# Patient Record
Sex: Male | Born: 1955 | Race: White | Hispanic: No | Marital: Single | Smoking: Current every day smoker
Health system: Southern US, Community
[De-identification: ages and names within clinical notes are randomized; demographics above are authoritative.]

## PROBLEM LIST (undated history)

## (undated) HISTORY — PX: ANKLE FRACTURE SURGERY: SHX122

---

## 1999-04-10 ENCOUNTER — Emergency Department (HOSPITAL_COMMUNITY): Admission: EM | Admit: 1999-04-10 | Discharge: 1999-04-10 | Payer: Self-pay | Admitting: Emergency Medicine

## 2011-10-09 ENCOUNTER — Encounter (HOSPITAL_COMMUNITY): Payer: Self-pay | Admitting: Emergency Medicine

## 2011-10-09 ENCOUNTER — Emergency Department (HOSPITAL_COMMUNITY)
Admission: EM | Admit: 2011-10-09 | Discharge: 2011-10-09 | Disposition: A | Discharge status: Home / Self Care | Payer: Self-pay | Attending: Emergency Medicine | Admitting: Emergency Medicine

## 2011-10-09 ENCOUNTER — Emergency Department (HOSPITAL_COMMUNITY): Payer: Self-pay

## 2011-10-09 DIAGNOSIS — M545 Low back pain, unspecified: Secondary | ICD-10-CM | POA: Insufficient documentation

## 2011-10-09 DIAGNOSIS — IMO0002 Reserved for concepts with insufficient information to code with codable children: Secondary | ICD-10-CM

## 2011-10-09 DIAGNOSIS — S39012A Strain of muscle, fascia and tendon of lower back, initial encounter: Secondary | ICD-10-CM

## 2011-10-09 DIAGNOSIS — F17200 Nicotine dependence, unspecified, uncomplicated: Secondary | ICD-10-CM | POA: Insufficient documentation

## 2011-10-09 MED ORDER — CYCLOBENZAPRINE HCL 10 MG PO TABS: 10.0000 mg | ORAL_TABLET | Freq: Three times a day (TID) | ORAL | Status: AC | PRN

## 2011-10-09 MED ORDER — PREDNISONE 50 MG PO TABS: 50.0000 mg | ORAL_TABLET | Freq: Every day | ORAL | Status: AC

## 2011-10-09 MED ORDER — KETOROLAC TROMETHAMINE 60 MG/2ML IM SOLN
60.0000 mg | Freq: Once | INTRAMUSCULAR | Status: AC
  Administered 2011-10-09: 60 mg via INTRAMUSCULAR
  Filled 2011-10-09: qty 2

## 2011-10-09 MED ORDER — HYDROCODONE-ACETAMINOPHEN 5-325 MG PO TABS: 1.0000 | ORAL_TABLET | Freq: Four times a day (QID) | ORAL | Status: AC | PRN
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
## 2011-10-09 NOTE — ED Notes (Addendum)
Pt reports having back pain that started over a year ago that has progressively gotten worse.  At its worse the pain is 10/10 with it being constant burning pain for about 2-3 hours before any relief.  Pain is 5/10 at the moment.  The pain radiates from his lower back bilaterally to the right side of his abdomen.  The pain in the past has cause him to vomit with his last episode being this past Monday.  Pt has a history of back problems and spasms but none like what he is experiencing today. Pt denies any n/v/d today.  Pt takes acetaminophen and advil at home.  He also wears a back brace but did not bring it today. Nad.

## 2011-10-09 NOTE — ED Provider Notes (Signed)
Medical screening examination/treatment/procedure(s) were performed by non-physician practitioner and as supervising physician I was immediately available for consultation/collaboration.  Flint Melter, MD 10/09/11 1729

## 2011-10-09 NOTE — ED Provider Notes (Signed)
History     CSN: [id number]  Arrival date & time 10/09/11  4098   First MD Initiated Contact with Patient 10/09/11 [phone]      Chief Complaint  Patient presents with  . Back Pain    (Consider location/radiation/quality/duration/timing/severity/associated sxs/prior treatment) HPI Patient presents emergency department with dental lower back pain and been ongoing for a long period of time patient states, that he's had increased pain over the last 2-3 days. The patient denies CP, SOB, weakness, numbness, nausea, vomiting, gait disturbance, dysuria, bowel incontinence, or bladder incontinence. The patient denies taking anything prior to arrival.  History reviewed. No pertinent past medical history.  Past Surgical History  Procedure Date  . Ankle fracture surgery     metal plate with 9 scews    Family History  Problem Relation Age of Onset  . Diabetes Mother   . Diabetes Father   . Cancer Sister     History  Substance Use Topics  . Smoking status: Current Everyday Smoker -- 2.0 packs/day for 20 years    Types: Cigarettes  . Smokeless tobacco: Not on file  . Alcohol Use: No      Review of Systems All other systems negative except as documented in the HPI. All pertinent positives and negatives as reviewed in the HPI.  Allergies  Review of patient's allergies indicates no known allergies.  Home Medications   Current Outpatient Rx  Name Route Sig Dispense Refill  . ADVIL PM PO Oral Take 2 tablets by mouth once. sleep      BP 158/98  Pulse 101  Temp 99.8 F (37.7 C) (Oral)  Resp 18  Ht 5' 11.5" (1.816 m)  Wt 152 lb (68.947 kg)  BMI 20.90 kg/m2  SpO2 100%  Physical Exam  Nursing note and vitals reviewed. Constitutional: He is oriented to person, place, and time. He appears well-developed and well-nourished. No distress.  Cardiovascular: Normal rate, regular rhythm and normal heart sounds.   Pulmonary/Chest: Effort normal and breath sounds normal. He has no  wheezes. He exhibits no tenderness.  Abdominal: Soft. Bowel sounds are normal. He exhibits no distension. There is no tenderness.  Musculoskeletal:       Thoracic back: He exhibits tenderness. He exhibits normal range of motion, no bony tenderness, no deformity and no spasm.       Lumbar back: He exhibits normal range of motion, no tenderness and no bony tenderness.       Back:  Neurological: He is alert and oriented to person, place, and time. He displays normal reflexes. Coordination normal.    ED Course  Procedures (including critical care time)  Labs Reviewed - No data to display Dg Thoracic Spine 2 View  10/09/2011  *RADIOLOGY REPORT*  Clinical Data: Chronic diffuse upper and lower back pain  THORACIC SPINE - 2 VIEW  Comparison: None.  Findings: Normal alignment of the thoracic vertebral bodies.  No loss of to body height or disc height.  No subluxation. Degenerative osteophytosis of the thoracic spine. .  Normal paraspinal lines.  IMPRESSION: No acute findings of the thoracic spine.  Degenerative osteophytosis noted.   Original Report Authenticated By: Genevive Bi, M.D.    Dg Lumbar Spine Complete  10/09/2011  *RADIOLOGY REPORT*  Clinical Data: Low back pain  LUMBAR SPINE - COMPLETE 4+ VIEW  Comparison: None.  Findings: Five lumbar-type vertebral bodies.  No evidence of fracture or dislocation.  Vertebral body heights are maintained.  Mild multilevel degenerative changes.  Visualized bony pelvis  appears intact.  IMPRESSION: No fracture or dislocation is seen.  Mild multilevel degenerative changes.   Original Report Authenticated By: Charline Bills, M.D.      Patient has normal neuro function. The patient is advised the use of ice and heat. This pain is chronic over several years. The patient has normal DTRs. The patient is able to ambulate without issues. Told to return here as needed.  MDM          Carlyle Dolly, PA-C 10/09/11 1341

## 2011-10-09 NOTE — ED Notes (Signed)
Bed:WA05<BR> Expected date:<BR> Expected time:<BR> Means of arrival:<BR> Comments:<BR> closed

## 2018-12-17 DIAGNOSIS — G8929 Other chronic pain: Secondary | ICD-10-CM | POA: Insufficient documentation

## 2018-12-30 DIAGNOSIS — R7303 Prediabetes: Secondary | ICD-10-CM | POA: Insufficient documentation

## 2018-12-30 DIAGNOSIS — E782 Mixed hyperlipidemia: Secondary | ICD-10-CM | POA: Diagnosis present

## 2018-12-30 DIAGNOSIS — I10 Essential (primary) hypertension: Secondary | ICD-10-CM | POA: Diagnosis present

## 2020-04-09 ENCOUNTER — Other Ambulatory Visit: Payer: Self-pay

## 2020-04-09 ENCOUNTER — Observation Stay (HOSPITAL_COMMUNITY)
Admission: EM | Admit: 2020-04-09 | Discharge: 2020-04-10 | Disposition: A | Discharge status: Home / Self Care | Payer: BLUE CROSS/BLUE SHIELD | Attending: Surgery | Admitting: Surgery

## 2020-04-09 ENCOUNTER — Emergency Department (HOSPITAL_COMMUNITY): Payer: BLUE CROSS/BLUE SHIELD

## 2020-04-09 ENCOUNTER — Encounter (HOSPITAL_COMMUNITY): Payer: Self-pay | Admitting: Pharmacy Technician

## 2020-04-09 DIAGNOSIS — S0990XA Unspecified injury of head, initial encounter: Secondary | ICD-10-CM

## 2020-04-09 DIAGNOSIS — Z20822 Contact with and (suspected) exposure to covid-19: Secondary | ICD-10-CM | POA: Insufficient documentation

## 2020-04-09 DIAGNOSIS — J939 Pneumothorax, unspecified: Secondary | ICD-10-CM | POA: Diagnosis not present

## 2020-04-09 DIAGNOSIS — I700 Atherosclerosis of aorta: Secondary | ICD-10-CM | POA: Insufficient documentation

## 2020-04-09 DIAGNOSIS — K8020 Calculus of gallbladder without cholecystitis without obstruction: Secondary | ICD-10-CM | POA: Insufficient documentation

## 2020-04-09 DIAGNOSIS — S2242XA Multiple fractures of ribs, left side, initial encounter for closed fracture: Secondary | ICD-10-CM | POA: Diagnosis not present

## 2020-04-09 DIAGNOSIS — S0001XA Abrasion of scalp, initial encounter: Secondary | ICD-10-CM | POA: Diagnosis not present

## 2020-04-09 DIAGNOSIS — F10920 Alcohol use, unspecified with intoxication, uncomplicated: Secondary | ICD-10-CM

## 2020-04-09 DIAGNOSIS — F10220 Alcohol dependence with intoxication, uncomplicated: Secondary | ICD-10-CM | POA: Diagnosis not present

## 2020-04-09 DIAGNOSIS — K4090 Unilateral inguinal hernia, without obstruction or gangrene, not specified as recurrent: Secondary | ICD-10-CM | POA: Insufficient documentation

## 2020-04-09 DIAGNOSIS — S270XXA Traumatic pneumothorax, initial encounter: Secondary | ICD-10-CM

## 2020-04-09 LAB — RESP PANEL BY RT-PCR (FLU A&B, COVID) ARPGX2
Influenza A by PCR: NEGATIVE
Influenza B by PCR: NEGATIVE
SARS Coronavirus 2 by RT PCR: NEGATIVE

## 2020-04-09 LAB — CBC
HCT: 47.5 % (ref 39.0–52.0)
HCT: 47.9 % (ref 39.0–52.0)
Hemoglobin: 16 g/dL (ref 13.0–17.0)
Hemoglobin: 16 g/dL (ref 13.0–17.0)
MCH: 33.8 pg (ref 26.0–34.0)
MCH: 32.6 pg (ref 26.0–34.0)
MCHC: 33.7 g/dL (ref 30.0–36.0)
MCHC: 33.4 g/dL (ref 30.0–36.0)
MCV: 100.2 fL — ABNORMAL HIGH (ref 80.0–100.0)
MCV: 97.6 fL (ref 80.0–100.0)
Platelets: 215 10*3/uL (ref 150–400)
Platelets: 243 10*3/uL (ref 150–400)
RBC: 4.74 MIL/uL (ref 4.22–5.81)
RBC: 4.91 MIL/uL (ref 4.22–5.81)
RDW: 12.6 % (ref 11.5–15.5)
RDW: 12.7 % (ref 11.5–15.5)
WBC: 9 10*3/uL (ref 4.0–10.5)
WBC: 14.7 10*3/uL — ABNORMAL HIGH (ref 4.0–10.5)
nRBC: 0 % (ref 0.0–0.2)
nRBC: 0 % (ref 0.0–0.2)

## 2020-04-09 LAB — COMPREHENSIVE METABOLIC PANEL
ALT: 23 U/L (ref 0–44)
ALT: 26 U/L (ref 0–44)
AST: 26 U/L (ref 15–41)
AST: 51 U/L — ABNORMAL HIGH (ref 15–41)
Albumin: 4.1 g/dL (ref 3.5–5.0)
Albumin: 4.4 g/dL (ref 3.5–5.0)
Alkaline Phosphatase: 55 U/L (ref 38–126)
Alkaline Phosphatase: 54 U/L (ref 38–126)
Anion gap: 12 (ref 5–15)
Anion gap: 13 (ref 5–15)
BUN: 15 mg/dL (ref 8–23)
BUN: 12 mg/dL (ref 8–23)
CO2: 21 mmol/L — ABNORMAL LOW (ref 22–32)
CO2: 24 mmol/L (ref 22–32)
Calcium: 9 mg/dL (ref 8.9–10.3)
Calcium: 9 mg/dL (ref 8.9–10.3)
Chloride: 110 mmol/L (ref 98–111)
Chloride: 104 mmol/L (ref 98–111)
Creatinine, Ser: 0.96 mg/dL (ref 0.61–1.24)
Creatinine, Ser: 0.83 mg/dL (ref 0.61–1.24)
GFR, Estimated: >60 mL/min (ref >60)
GFR, Estimated: >60 mL/min (ref >60)
Glucose, Bld: 100 mg/dL — ABNORMAL HIGH (ref 70–99)
Glucose, Bld: 113 mg/dL — ABNORMAL HIGH (ref 70–99)
Potassium: 3.9 mmol/L (ref 3.5–5.1)
Potassium: 3.9 mmol/L (ref 3.5–5.1)
Sodium: 143 mmol/L (ref 135–145)
Sodium: 141 mmol/L (ref 135–145)
Total Bilirubin: 0.7 mg/dL (ref 0.3–1.2)
Total Bilirubin: 0.6 mg/dL (ref 0.3–1.2)
Total Protein: 6.5 g/dL (ref 6.5–8.1)
Total Protein: 7 g/dL (ref 6.5–8.1)

## 2020-04-09 LAB — I-STAT CHEM 8, ED
BUN: 20 mg/dL (ref 8–23)
Calcium, Ion: 0.91 mmol/L — ABNORMAL LOW (ref 1.15–1.40)
Chloride: 110 mmol/L (ref 98–111)
Creatinine, Ser: 1.1 mg/dL (ref 0.61–1.24)
Glucose, Bld: 96 mg/dL (ref 70–99)
HCT: 47 % (ref 39.0–52.0)
Hemoglobin: 16 g/dL (ref 13.0–17.0)
Potassium: 4.6 mmol/L (ref 3.5–5.1)
Sodium: 142 mmol/L (ref 135–145)
TCO2: 25 mmol/L (ref 22–32)

## 2020-04-09 LAB — PROTIME-INR
INR: 1.1 (ref 0.8–1.2)
Prothrombin Time: 13.4 seconds (ref 11.4–15.2)

## 2020-04-09 LAB — MAGNESIUM: Magnesium: 1.9 mg/dL (ref 1.7–2.4)

## 2020-04-09 LAB — URINALYSIS, ROUTINE W REFLEX MICROSCOPIC
Bacteria, UA: NONE SEEN
Bilirubin Urine: NEGATIVE
Glucose, UA: NEGATIVE mg/dL
Ketones, ur: NEGATIVE mg/dL
Leukocytes,Ua: NEGATIVE
Nitrite: NEGATIVE
Protein, ur: NEGATIVE mg/dL
Specific Gravity, Urine: 1.005 (ref 1.005–1.030)
pH: 5 (ref 5.0–8.0)

## 2020-04-09 LAB — SAMPLE TO BLOOD BANK

## 2020-04-09 LAB — HIV ANTIBODY (ROUTINE TESTING W REFLEX): HIV Screen 4th Generation wRfx: NONREACTIVE

## 2020-04-09 LAB — LACTIC ACID, PLASMA: Lactic Acid, Venous: 2.3 mmol/L (ref 0.5–1.9)

## 2020-04-09 LAB — ETHANOL: Alcohol, Ethyl (B): 207 mg/dL — ABNORMAL HIGH (ref <10)

## 2020-04-09 LAB — PHOSPHORUS: Phosphorus: 2.9 mg/dL (ref 2.5–4.6)

## 2020-04-09 MED ORDER — LORAZEPAM 1 MG PO TABS: 1.0000 mg | ORAL_TABLET | ORAL | Status: DC | PRN

## 2020-04-09 MED ORDER — FOLIC ACID 1 MG PO TABS
1.0000 mg | ORAL_TABLET | Freq: Every day | ORAL | Status: DC
  Administered 2020-04-09: 1 mg via ORAL
  Filled 2020-04-09: qty 1

## 2020-04-09 MED ORDER — ADULT MULTIVITAMIN W/MINERALS CH
1.0000 | ORAL_TABLET | Freq: Every day | ORAL | Status: DC
  Administered 2020-04-09: 1 via ORAL
  Filled 2020-04-09: qty 1

## 2020-04-09 MED ORDER — SPIRITUS FRUMENTI: 1.0000 | Freq: Three times a day (TID) | ORAL | Status: DC | PRN

## 2020-04-09 MED ORDER — SODIUM CHLORIDE 0.9 % IV BOLUS: 125.0000 mL | Freq: Once | INTRAVENOUS | Status: DC

## 2020-04-09 MED ORDER — IOHEXOL 300 MG/ML  SOLN
100.0000 mL | Freq: Once | INTRAMUSCULAR | Status: AC | PRN
  Administered 2020-04-09: 100 mL via INTRAVENOUS

## 2020-04-09 MED ORDER — SODIUM CHLORIDE 0.9 % IV SOLN: INTRAVENOUS | Status: DC

## 2020-04-09 MED ORDER — BISACODYL 10 MG RE SUPP: 10.0000 mg | Freq: Every day | RECTAL | Status: DC | PRN

## 2020-04-09 MED ORDER — ACETAMINOPHEN 325 MG PO TABS: 650.0000 mg | ORAL_TABLET | ORAL | Status: DC | PRN

## 2020-04-09 MED ORDER — AMLODIPINE BESYLATE 10 MG PO TABS
10.0000 mg | ORAL_TABLET | Freq: Every day | ORAL | Status: DC
  Administered 2020-04-09: 10 mg via ORAL
  Filled 2020-04-09: qty 1

## 2020-04-09 MED ORDER — LORAZEPAM 2 MG/ML IJ SOLN: 1.0000 mg | INTRAMUSCULAR | Status: DC | PRN

## 2020-04-09 MED ORDER — ONDANSETRON 4 MG PO TBDP: 4.0000 mg | ORAL_TABLET | Freq: Four times a day (QID) | ORAL | Status: DC | PRN

## 2020-04-09 MED ORDER — ENOXAPARIN SODIUM 30 MG/0.3ML ~~LOC~~ SOLN: 30.0000 mg | Freq: Two times a day (BID) | SUBCUTANEOUS | Status: DC

## 2020-04-09 MED ORDER — OXYCODONE HCL 5 MG PO TABS
5.0000 mg | ORAL_TABLET | ORAL | Status: DC | PRN
  Administered 2020-04-09 – 2020-04-10 (×2): 5 mg via ORAL
  Filled 2020-04-09 (×2): qty 1

## 2020-04-09 MED ORDER — POLYETHYLENE GLYCOL 3350 17 G PO PACK
17.0000 g | PACK | Freq: Every day | ORAL | Status: DC
  Administered 2020-04-09: 17 g via ORAL
  Filled 2020-04-09: qty 1

## 2020-04-09 MED ORDER — METHOCARBAMOL 500 MG PO TABS
500.0000 mg | ORAL_TABLET | Freq: Three times a day (TID) | ORAL | Status: DC | PRN
  Administered 2020-04-09 – 2020-04-10 (×2): 500 mg via ORAL
  Filled 2020-04-09 (×2): qty 1

## 2020-04-09 MED ORDER — THIAMINE HCL 100 MG PO TABS
100.0000 mg | ORAL_TABLET | Freq: Every day | ORAL | Status: DC
  Administered 2020-04-09: 100 mg via ORAL
  Filled 2020-04-09: qty 1

## 2020-04-09 MED ORDER — MORPHINE SULFATE (PF) 2 MG/ML IV SOLN: 2.0000 mg | INTRAVENOUS | Status: DC | PRN

## 2020-04-09 MED ORDER — DOCUSATE SODIUM 100 MG PO CAPS
100.0000 mg | ORAL_CAPSULE | Freq: Two times a day (BID) | ORAL | Status: DC
  Administered 2020-04-09: 100 mg via ORAL
  Filled 2020-04-09: qty 1

## 2020-04-09 MED ORDER — ONDANSETRON HCL 4 MG/2ML IJ SOLN: 4.0000 mg | Freq: Four times a day (QID) | INTRAMUSCULAR | Status: DC | PRN

## 2020-04-09 MED ORDER — METOPROLOL TARTRATE 5 MG/5ML IV SOLN: 5.0000 mg | Freq: Four times a day (QID) | INTRAVENOUS | Status: DC | PRN

## 2020-04-09 MED ORDER — THIAMINE HCL 100 MG/ML IJ SOLN: 100.0000 mg | Freq: Every day | INTRAMUSCULAR | Status: DC

## 2020-04-09 NOTE — Plan of Care (Signed)
  Problem: Activity: Goal: Risk for activity intolerance will decrease Outcome: Progressing   Problem: Nutrition: Goal: Adequate nutrition will be maintained Outcome: Progressing   Problem: Coping: Goal: Level of anxiety will decrease Outcome: Progressing   Problem: Safety: Goal: Ability to remain free from injury will improve Outcome: Progressing   Problem: Pain Managment: Goal: General experience of comfort will improve Outcome: Progressing   Problem: Skin Integrity: Goal: Risk for impaired skin integrity will decrease Outcome: Progressing

## 2020-04-09 NOTE — ED Notes (Signed)
Dinner Tray Ordered @ 1648. 
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
## 2020-04-09 NOTE — ED Notes (Signed)
Pt returned from ct
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
## 2020-04-09 NOTE — Progress Notes (Signed)
Patient admitted to room, alert and oriented x3. Skin dry and warm to touch, abrasions to rt forehead, lt shoulder, lt hand and both knees. Patient c/o sore all over. No SOB voiced.

## 2020-04-10 LAB — CBC
HCT: 45.2 % (ref 39.0–52.0)
Hemoglobin: 15.1 g/dL (ref 13.0–17.0)
MCH: 32.8 pg (ref 26.0–34.0)
MCHC: 33.4 g/dL (ref 30.0–36.0)
MCV: 98.3 fL (ref 80.0–100.0)
Platelets: 211 10*3/uL (ref 150–400)
RBC: 4.6 MIL/uL (ref 4.22–5.81)
RDW: 12.7 % (ref 11.5–15.5)
WBC: 11.7 10*3/uL — ABNORMAL HIGH (ref 4.0–10.5)
nRBC: 0 % (ref 0.0–0.2)

## 2020-04-10 LAB — BASIC METABOLIC PANEL
Anion gap: 9 (ref 5–15)
BUN: 12 mg/dL (ref 8–23)
CO2: 27 mmol/L (ref 22–32)
Calcium: 9 mg/dL (ref 8.9–10.3)
Chloride: 104 mmol/L (ref 98–111)
Creatinine, Ser: 0.9 mg/dL (ref 0.61–1.24)
GFR, Estimated: >60 mL/min (ref >60)
Glucose, Bld: 102 mg/dL — ABNORMAL HIGH (ref 70–99)
Potassium: 3.7 mmol/L (ref 3.5–5.1)
Sodium: 140 mmol/L (ref 135–145)

## 2020-04-10 MED ORDER — OXYCODONE HCL 5 MG PO TABS: 5.0000 mg | ORAL_TABLET | ORAL | 0 refills | Status: DC | PRN

## 2020-04-10 MED ORDER — ACETAMINOPHEN 325 MG PO TABS: 650.0000 mg | ORAL_TABLET | ORAL | 1 refills | Status: DC | PRN

## 2020-04-10 MED ORDER — METHOCARBAMOL 500 MG PO TABS: 500.0000 mg | ORAL_TABLET | Freq: Three times a day (TID) | ORAL | 1 refills | Status: DC | PRN

## 2020-04-10 MED ORDER — DOCUSATE SODIUM 100 MG PO CAPS: 100.0000 mg | ORAL_CAPSULE | Freq: Two times a day (BID) | ORAL | 0 refills | Status: DC

## 2020-04-10 NOTE — Discharge Instructions (Signed)
Rib Fracture  A rib fracture is a break or crack in one of the bones of the ribs. The ribs are like a cage that goes around your upper chest. A broken or cracked rib is often painful, but most do not cause other problems. Most rib fractures usually heal on their own in 1-3 months. What are the causes?  Doing movements over and over again with a lot of force, such as pitching a baseball or having a very bad cough.  A direct hit to the chest.  Cancer that has spread to the bones. What are the signs or symptoms?  Pain when you breathe in or cough.  Pain when someone presses on the injured area.  Feeling short of breath. How is this treated? Treatment depends on how bad the fracture is. In general:  Most rib fractures usually heal on their own in 1-3 months.  Healing may take longer if you have a cough or are doing activities that make the injury worse.  While you heal, you may be given medicines to control pain.  You will also be taught deep breathing exercises.  Very bad injuries may require a stay at the hospital or surgery. Follow these instructions at home: Managing pain, stiffness, and swelling  If told, put ice on the injured area. To do this: ? Put ice in a plastic bag. ? Place a towel between your skin and the bag. ? Leave the ice on for 20 minutes, 2-3 times a day. ? Take off the ice if your skin turns bright red. This is very important. If you cannot feel pain, heat, or cold, you have a greater risk of damage to the area.  Take over-the-counter and prescription medicines only as told by your doctor. Activity  Avoid activities that cause pain to the injured area. Protect your injured area.  Slowly increase activity as told by your doctor. General instructions  Do deep breathing exercises as told by your doctor. You may be told to: ? Take deep breaths many times a day. ? Cough several times a day while hugging a pillow. ? Use a device (incentive spirometer) to do  deep breathing many times a day.  Drink enough fluid to keep your pee (urine) clear or pale yellow.  Do not wear a rib belt or binder.  Keep all follow-up visits. Contact a doctor if:  You have a fever. Get help right away if:  You have trouble breathing.  You are short of breath.  You cannot stop coughing.  You cough up thick or bloody spit.  You feel like you may vomit (nauseous), vomit, or have belly (abdominal) pain.  Your pain gets worse and medicine does not help. These symptoms may be an emergency. Get help right away. Call your local emergency services (911 in the [place]  Do not wait to see if the symptoms will go away.  Do not drive yourself to the hospital. Summary  A rib fracture is a break or crack in one of the bones of the ribs.  Apply ice to the injured area and take medicines for pain as told by your doctor.  Take deep breaths and cough several times a day. Hug a pillow every time you cough. This information is not intended to replace advice given to you by your health care provider. Make sure you discuss any questions you have with your health care provider. Document Revised: 05/20/2019 Document Reviewed: 05/20/2019 Elsevier Patient Education  2021 [employer].  Transverse Process Fracture  Bones of the spine (vertebrae) have portions that extend off to either side of the spine. These portions of bone are called transverse processes. A transverse process fracture, which is also called a rotation spine fracture, is a break in a transverse process. What are the causes? This condition may be caused by:  A fall from a great height.  A car accident.  A sports injury.  A gunshot wound.  A hard, direct hit to the back. This kind of fracture often results from a sudden and severe bending of the spine to one side. Depending on the cause of the fracture, one or more bones may be affected. What increases the risk? You are more likely to develop this  condition if:  You have thinning and loss of density in the bones (osteoporosis).  You play a contact sport. What are the signs or symptoms? The main symptom of this condition is back pain. The pain may:  Be felt on the side of the spine (flank) where the fracture is.  Get worse when you move or take a deep breath. How is this diagnosed? This condition may be diagnosed based on:  Your symptoms.  Your medical history.  A physical exam. You may also have other tests, including:  X-rays.  A CT scan.  MRI. How is this treated? Most transverse process fractures heal on their own with time and rest. Treatment may involve supportive care, such as:  Limiting activity.  Medicines, such as: ? Pain medicine. ? Muscle-relaxing medicine.  Physical therapy.  A neck or back brace. Follow these instructions at home: If you have a brace:  Wear the neck or back brace as told by your health care provider. Remove it only as told by your health care provider.  Keep the brace clean.  If the brace is not waterproof: ? Do not let it get wet. ? Cover it with a watertight covering when you take a bath or a shower. Managing pain, stiffness, and swelling  If directed, put ice on the injured area: ? If you have a removable brace, remove it as told by your health care provider. ? Put ice in a plastic bag. ? Place a towel between your skin and the bag. ? Leave the ice on for 20 minutes, 2-3 times a day.   Medicines  Take over-the-counter and prescription medicines only as told by your health care provider.  Do not drive or use heavy machinery while taking prescription pain medicine.  If you are taking prescription pain medicine, take actions to prevent or treat constipation. Your health care provider may recommend that you: ? Drink enough fluid to keep your urine pale yellow. ? Eat foods that are high in fiber, such as fresh fruits and vegetables, whole grains, and beans. ? Limit  foods that are high in fat and processed sugars, such as fried or sweet foods. ? Take an over-the-counter or prescription medicine for constipation. Activity  Stay in bed (on bed rest) only as directed by your health care provider. ? Avoid being in bed for a long time without moving. Get up to take short walks every 1-2 hours. This is important to improve blood flow and breathing. Ask for help if you feel weak or unsteady.  Return to your normal activities when your health care provider says it is okay. Ask if there are any activities that you should not do.  Do physical therapy exercises as recommended by your health care provider.  General instructions  Do not use any products that contain nicotine or tobacco, such as cigarettes and e-cigarettes. These can delay bone healing. If you need help quitting, ask your health care provider.  Keep all follow-up visits as told by your health care provider. This is important. Visits can help to prevent permanent injury, disability, and long-lasting (chronic) pain. Contact a health care provider if:  You have a fever.  You develop a cough that makes your pain worse.  Your pain medicine is not helping.  Your pain does not get better over time.  You cannot return to your normal activities as planned or expected. Get help right away if:  Your pain is very bad and it suddenly gets worse.  You are unable to move any body part (paralysis) that is below the level of your injury.  You have numbness, tingling, or weakness in any body part that is below the level of your injury.  You cannot control your bladder or bowels. Summary  A transverse process fracture is a break in the portion of the bone that extends to the side of the spine.  Most transverse process fractures heal on their own with time and rest.  You may also have supportive treatments such as a back brace, pain medicines, and physical therapy.  Keep all follow-up visits. This is  important and will help to prevent permanent injury, disability, and long-lasting (chronic) pain. This information is not intended to replace advice given to you by your health care provider. Make sure you discuss any questions you have with your health care provider. Document Revised: 03/11/2017 Document Reviewed: 03/11/2017 Elsevier Patient Education  2021 [employer].

## 2020-04-10 NOTE — Plan of Care (Signed)
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
## 2022-04-23 DIAGNOSIS — F1021 Alcohol dependence, in remission: Secondary | ICD-10-CM | POA: Insufficient documentation

## 2022-08-16 ENCOUNTER — Observation Stay (HOSPITAL_COMMUNITY): Payer: Medicare HMO

## 2022-08-16 ENCOUNTER — Inpatient Hospital Stay (HOSPITAL_COMMUNITY)
Admission: EM | Admit: 2022-08-16 | Discharge: 2022-08-22 | DRG: 896 | Disposition: A | Discharge status: Hospice | Payer: Medicare HMO | Attending: Internal Medicine | Admitting: Internal Medicine

## 2022-08-16 ENCOUNTER — Emergency Department (HOSPITAL_COMMUNITY): Payer: Medicare HMO

## 2022-08-16 ENCOUNTER — Other Ambulatory Visit: Payer: Self-pay

## 2022-08-16 DIAGNOSIS — F10239 Alcohol dependence with withdrawal, unspecified: Secondary | ICD-10-CM | POA: Diagnosis not present

## 2022-08-16 DIAGNOSIS — E782 Mixed hyperlipidemia: Secondary | ICD-10-CM | POA: Diagnosis present

## 2022-08-16 DIAGNOSIS — R410 Disorientation, unspecified: Secondary | ICD-10-CM

## 2022-08-16 DIAGNOSIS — I10 Essential (primary) hypertension: Secondary | ICD-10-CM | POA: Diagnosis present

## 2022-08-16 DIAGNOSIS — F0392 Unspecified dementia, unspecified severity, with psychotic disturbance: Secondary | ICD-10-CM | POA: Diagnosis present

## 2022-08-16 DIAGNOSIS — W19XXXA Unspecified fall, initial encounter: Secondary | ICD-10-CM | POA: Diagnosis present

## 2022-08-16 DIAGNOSIS — F17210 Nicotine dependence, cigarettes, uncomplicated: Secondary | ICD-10-CM | POA: Diagnosis present

## 2022-08-16 DIAGNOSIS — G9340 Encephalopathy, unspecified: Secondary | ICD-10-CM | POA: Diagnosis present

## 2022-08-16 DIAGNOSIS — D72829 Elevated white blood cell count, unspecified: Secondary | ICD-10-CM | POA: Insufficient documentation

## 2022-08-16 DIAGNOSIS — Y900 Blood alcohol level of less than 20 mg/100 ml: Secondary | ICD-10-CM | POA: Diagnosis present

## 2022-08-16 DIAGNOSIS — F1010 Alcohol abuse, uncomplicated: Secondary | ICD-10-CM

## 2022-08-16 DIAGNOSIS — Z66 Do not resuscitate: Secondary | ICD-10-CM | POA: Diagnosis present

## 2022-08-16 DIAGNOSIS — F0282 Dementia in other diseases classified elsewhere, unspecified severity, with psychotic disturbance: Secondary | ICD-10-CM | POA: Diagnosis present

## 2022-08-16 DIAGNOSIS — E43 Unspecified severe protein-calorie malnutrition: Secondary | ICD-10-CM | POA: Insufficient documentation

## 2022-08-16 DIAGNOSIS — F05 Delirium due to known physiological condition: Secondary | ICD-10-CM | POA: Diagnosis present

## 2022-08-16 DIAGNOSIS — E876 Hypokalemia: Secondary | ICD-10-CM | POA: Diagnosis not present

## 2022-08-16 DIAGNOSIS — Y92009 Unspecified place in unspecified non-institutional (private) residence as the place of occurrence of the external cause: Secondary | ICD-10-CM

## 2022-08-16 DIAGNOSIS — R441 Visual hallucinations: Secondary | ICD-10-CM

## 2022-08-16 DIAGNOSIS — R531 Weakness: Principal | ICD-10-CM

## 2022-08-16 DIAGNOSIS — Z681 Body mass index (BMI) 19 or less, adult: Secondary | ICD-10-CM

## 2022-08-16 DIAGNOSIS — E512 Wernicke's encephalopathy: Secondary | ICD-10-CM | POA: Diagnosis present

## 2022-08-16 DIAGNOSIS — Z5941 Food insecurity: Secondary | ICD-10-CM

## 2022-08-16 DIAGNOSIS — E8809 Other disorders of plasma-protein metabolism, not elsewhere classified: Secondary | ICD-10-CM | POA: Diagnosis present

## 2022-08-16 DIAGNOSIS — Z79899 Other long term (current) drug therapy: Secondary | ICD-10-CM

## 2022-08-16 DIAGNOSIS — M6282 Rhabdomyolysis: Secondary | ICD-10-CM | POA: Diagnosis not present

## 2022-08-16 DIAGNOSIS — Z833 Family history of diabetes mellitus: Secondary | ICD-10-CM

## 2022-08-16 DIAGNOSIS — R296 Repeated falls: Secondary | ICD-10-CM | POA: Diagnosis present

## 2022-08-16 DIAGNOSIS — Z515 Encounter for palliative care: Secondary | ICD-10-CM

## 2022-08-16 LAB — URINALYSIS, ROUTINE W REFLEX MICROSCOPIC
Bilirubin Urine: NEGATIVE
Glucose, UA: NEGATIVE mg/dL
Ketones, ur: NEGATIVE mg/dL
Leukocytes,Ua: NEGATIVE
Nitrite: NEGATIVE
Protein, ur: NEGATIVE mg/dL
Specific Gravity, Urine: 1.013 (ref 1.005–1.030)
pH: 5 (ref 5.0–8.0)

## 2022-08-16 LAB — BASIC METABOLIC PANEL WITH GFR
Anion gap: 14 (ref 5–15)
BUN: 24 mg/dL — ABNORMAL HIGH (ref 8–23)
CO2: 20 mmol/L — ABNORMAL LOW (ref 22–32)
Calcium: 9.4 mg/dL (ref 8.9–10.3)
Chloride: 97 mmol/L — ABNORMAL LOW (ref 98–111)
Creatinine, Ser: 1.12 mg/dL (ref 0.61–1.24)
GFR, Estimated: >60 mL/min
Glucose, Bld: 100 mg/dL — ABNORMAL HIGH (ref 70–99)
Potassium: 4.4 mmol/L (ref 3.5–5.1)
Sodium: 131 mmol/L — ABNORMAL LOW (ref 135–145)

## 2022-08-16 LAB — CK: Total CK: 1434 U/L — ABNORMAL HIGH (ref 49–397)

## 2022-08-16 LAB — RAPID URINE DRUG SCREEN, HOSP PERFORMED
Amphetamines: NOT DETECTED
Barbiturates: NOT DETECTED
Benzodiazepines: NOT DETECTED
Cocaine: NOT DETECTED
Opiates: NOT DETECTED
Tetrahydrocannabinol: NOT DETECTED

## 2022-08-16 LAB — PHOSPHORUS: Phosphorus: 3.6 mg/dL (ref 2.5–4.6)

## 2022-08-16 LAB — CBC
HCT: 42 % (ref 39.0–52.0)
Hemoglobin: 14.4 g/dL (ref 13.0–17.0)
MCH: 31.9 pg (ref 26.0–34.0)
MCHC: 34.3 g/dL (ref 30.0–36.0)
MCV: 93.1 fL (ref 80.0–100.0)
Platelets: 312 K/uL (ref 150–400)
RBC: 4.51 MIL/uL (ref 4.22–5.81)
RDW: 13.2 % (ref 11.5–15.5)
WBC: 15.9 K/uL — ABNORMAL HIGH (ref 4.0–10.5)
nRBC: 0 % (ref 0.0–0.2)

## 2022-08-16 LAB — HEPATIC FUNCTION PANEL
ALT: 35 U/L (ref 0–44)
AST: 47 U/L — ABNORMAL HIGH (ref 15–41)
Albumin: 4.3 g/dL (ref 3.5–5.0)
Alkaline Phosphatase: 63 U/L (ref 38–126)
Bilirubin, Direct: 0.2 mg/dL (ref 0.0–0.2)
Indirect Bilirubin: 0.7 mg/dL (ref 0.3–0.9)
Total Bilirubin: 0.9 mg/dL (ref 0.3–1.2)
Total Protein: 7.1 g/dL (ref 6.5–8.1)

## 2022-08-16 LAB — TSH: TSH: 2.196 u[IU]/mL (ref 0.350–4.500)

## 2022-08-16 LAB — MAGNESIUM: Magnesium: 2.2 mg/dL (ref 1.7–2.4)

## 2022-08-16 LAB — VITAMIN B12: Vitamin B-12: 475 pg/mL (ref 180–914)

## 2022-08-16 LAB — ETHANOL: Alcohol, Ethyl (B): <10 mg/dL (ref <10)

## 2022-08-16 LAB — HIV ANTIBODY (ROUTINE TESTING W REFLEX): HIV Screen 4th Generation wRfx: NONREACTIVE

## 2022-08-16 LAB — AMMONIA: Ammonia: 35 umol/L (ref 9–35)

## 2022-08-16 LAB — CBG MONITORING, ED: Glucose-Capillary: 99 mg/dL (ref 70–99)

## 2022-08-16 MED ORDER — SODIUM CHLORIDE 0.9% FLUSH
3.0000 mL | Freq: Two times a day (BID) | INTRAVENOUS | Status: DC
  Administered 2022-08-17 – 2022-08-20 (×6): 3 mL via INTRAVENOUS

## 2022-08-16 MED ORDER — LORAZEPAM 2 MG/ML IJ SOLN
1.0000 mg | INTRAMUSCULAR | Status: AC | PRN
  Administered 2022-08-16: 2 mg via INTRAVENOUS
  Administered 2022-08-17: 1 mg via INTRAVENOUS
  Administered 2022-08-18 – 2022-08-19 (×4): 2 mg via INTRAVENOUS
  Filled 2022-08-16 (×7): qty 1

## 2022-08-16 MED ORDER — NICOTINE POLACRILEX 2 MG MT GUM: 2.0000 mg | CHEWING_GUM | OROMUCOSAL | Status: DC | PRN

## 2022-08-16 MED ORDER — THIAMINE HCL 100 MG/ML IJ SOLN
500.0000 mg | Freq: Three times a day (TID) | INTRAVENOUS | Status: DC
  Administered 2022-08-16 (×2): 500 mg via INTRAVENOUS
  Filled 2022-08-16 (×7): qty 5

## 2022-08-16 MED ORDER — ROSUVASTATIN CALCIUM 5 MG PO TABS
10.0000 mg | ORAL_TABLET | Freq: Every day | ORAL | Status: DC
  Administered 2022-08-17: 10 mg via ORAL
  Filled 2022-08-16: qty 2

## 2022-08-16 MED ORDER — SODIUM CHLORIDE 0.9 % IV SOLN: INTRAVENOUS | Status: DC | PRN

## 2022-08-16 MED ORDER — ENOXAPARIN SODIUM 40 MG/0.4ML IJ SOSY
40.0000 mg | PREFILLED_SYRINGE | INTRAMUSCULAR | Status: DC
  Administered 2022-08-16 – 2022-08-21 (×6): 40 mg via SUBCUTANEOUS
  Filled 2022-08-16 (×6): qty 0.4

## 2022-08-16 MED ORDER — NICOTINE 14 MG/24HR TD PT24
14.0000 mg | MEDICATED_PATCH | Freq: Every day | TRANSDERMAL | Status: DC
  Administered 2022-08-16 – 2022-08-22 (×7): 14 mg via TRANSDERMAL
  Filled 2022-08-16 (×7): qty 1

## 2022-08-16 MED ORDER — ACETAMINOPHEN 650 MG RE SUPP: 650.0000 mg | Freq: Four times a day (QID) | RECTAL | Status: DC | PRN

## 2022-08-16 MED ORDER — LORAZEPAM 1 MG PO TABS
1.0000 mg | ORAL_TABLET | ORAL | Status: AC | PRN
  Administered 2022-08-18: 2 mg via ORAL
  Administered 2022-08-19: 1 mg via ORAL
  Filled 2022-08-16: qty 1
  Filled 2022-08-16: qty 2

## 2022-08-16 MED ORDER — FOLIC ACID 1 MG PO TABS
1.0000 mg | ORAL_TABLET | Freq: Every day | ORAL | Status: DC
  Administered 2022-08-16 – 2022-08-20 (×5): 1 mg via ORAL
  Filled 2022-08-16 (×5): qty 1

## 2022-08-16 MED ORDER — AMLODIPINE BESYLATE 10 MG PO TABS
10.0000 mg | ORAL_TABLET | Freq: Every day | ORAL | Status: DC
  Administered 2022-08-17 – 2022-08-21 (×5): 10 mg via ORAL
  Filled 2022-08-16 (×5): qty 1

## 2022-08-16 MED ORDER — ADULT MULTIVITAMIN W/MINERALS CH
1.0000 | ORAL_TABLET | Freq: Every day | ORAL | Status: DC
  Administered 2022-08-16 – 2022-08-20 (×5): 1 via ORAL
  Filled 2022-08-16 (×5): qty 1

## 2022-08-16 MED ORDER — ACETAMINOPHEN 325 MG PO TABS: 650.0000 mg | ORAL_TABLET | Freq: Four times a day (QID) | ORAL | Status: DC | PRN

## 2022-08-16 MED ORDER — LACTATED RINGERS IV BOLUS
1000.0000 mL | Freq: Once | INTRAVENOUS | Status: AC
  Administered 2022-08-16: 1000 mL via INTRAVENOUS

## 2022-08-16 MED ORDER — POLYETHYLENE GLYCOL 3350 17 G PO PACK: 17.0000 g | PACK | Freq: Every day | ORAL | Status: DC | PRN

## 2022-08-16 MED ORDER — LORAZEPAM 1 MG PO TABS
1.0000 mg | ORAL_TABLET | ORAL | Status: DC | PRN
  Administered 2022-08-16: 2 mg via ORAL
  Filled 2022-08-16: qty 2

## 2022-08-16 NOTE — H&P (Signed)
History and Physical   Gary Mathis ZOX:[id number] DOB: Apr 12, 1955 DOA: 08/16/2022  PCP: Patient, No Pcp Per   Patient coming from: Home  Chief Complaint: Encephalopathy, weakness  HPI: Gary Mathis is a 67 y.o. male with medical history significant of hypertension, hyperlipidemia, low back pain, alcohol use presenting with weakness and altered mental status.  Patient was found down at home after a fall when his sister went to check on him.  Reportedly he has been hallucinating and seeing people who are not there.  He had a fall about a week and a half ago and has had some intermittent confusion since that time.  This is gotten worse in the past 2 to 3 days.  Has some history of alcohol use, drinks beer, denies heavy alcohol use.  Does report marijuana use but no other illicit substance use.  Denies psychiatric history.  Sister reports recent weight loss and concerns about his nutritional intake recently.  Patient denies fevers, chills, chest pain, shortness of breath, abdominal pain, constipation, diarrhea, nausea, vomiting.  ED Course: Vital signs in the ED notable for blood pressure in the 130s to 140s systolic, heart rate in the 80s to 100s.  Lab workup included BMP with sodium 131, chloride 97, bicarb 20, BUN 24, creatinine stable 1.12, glucose 100.  CBC with leukocytosis to 15.9.  CK pending.  Urinalysis with hemoglobin and rare bacteria only.  Ethanol level negative.  Ammonia level and UDS pending.  CT head showed no acute normality but did show chronic changes including chronic ischemic changes.  CT C-spine showed no acute normality but did show degenerative disc disease.  Left knee x-ray pending.  Patient ordered vitamins including IV thiamine in the ED with concern for possible Warnicke's encephalopathy.  Also ordered nicotine replacement, liter of IV fluids and CIWA with Ativan.  Review of Systems: As per HPI otherwise all other systems reviewed and are negative.  No past  medical history on file.  Past Surgical History:  Procedure Laterality Date   ANKLE FRACTURE SURGERY     metal plate with 9 scews    Social History  reports that he has been smoking cigarettes. He has a 40.00 pack-year smoking history. He does not have any smokeless tobacco history on file. He reports that he does not drink alcohol and does not use drugs.  No Known Allergies  Family History  Problem Relation Age of Onset   Diabetes Mother    Diabetes Father    Cancer Sister   Reviewed on admission  Prior to Admission medications   Medication Sig Start Date End Date Taking? Authorizing Provider  acetaminophen (TYLENOL) 500 MG tablet Take 1,000 mg by mouth 2 (two) times daily as needed for moderate pain, fever or headache.   Yes [provider]  amLODipine (NORVASC) 10 MG tablet Take 10 mg by mouth daily.   Yes [provider]  CALCIUM MAGNESIUM ZINC PO Take 1 tablet by mouth every other day.   Yes [provider]  Cholecalciferol (VITAMIN D-3 PO) Take 1 tablet by mouth every other day.   Yes [provider]  Multiple Vitamin (MULTIVITAMIN) tablet Take 1 tablet by mouth every other day.   Yes [provider]  rosuvastatin (CRESTOR) 10 MG tablet Take 10 mg by mouth daily.   Yes [provider]  telmisartan (MICARDIS) 20 MG tablet Take 20 mg by mouth daily.   Yes [provider]    Physical Exam: Vitals:   08/16/22 1150  08/16/22 1400 08/16/22 1600 08/16/22 1600  BP:  132/69  130/78  Pulse:  89  93  Resp:  20    Temp:   98 F (36.7 C)   TempSrc:   Oral   SpO2:  96%    Weight: 49.9 kg     Height: 5\' 11"  (1.803 m)       Physical Exam Constitutional:      General: He is not in acute distress.    Comments: Thin, somewhat chronically ill appearing  HENT:     Head: Normocephalic and atraumatic.     Mouth/Throat:     Mouth: Mucous membranes are moist.     Pharynx: Oropharynx is clear.  Eyes:     Extraocular  Movements: Extraocular movements intact.     Pupils: Pupils are equal, round, and reactive to light.  Cardiovascular:     Rate and Rhythm: Normal rate and regular rhythm.     Pulses: Normal pulses.     Heart sounds: Normal heart sounds.  Pulmonary:     Effort: Pulmonary effort is normal. No respiratory distress.     Breath sounds: Normal breath sounds.  Abdominal:     General: Bowel sounds are normal. There is no distension.     Palpations: Abdomen is soft.     Tenderness: There is no abdominal tenderness.  Musculoskeletal:        General: No swelling or deformity.  Skin:    General: Skin is warm and dry.  Neurological:     Comments: Mental Status: Patient is awake, alert No signs of aphasia or neglect Cranial Nerves: II: Pupils equal, round, and reactive to light.   III,IV, VI: EOMI without ptosis or diploplia.  V: Facial sensation is symmetric to light touch. VII: Facial movement is symmetric.  VIII: hearing is intact to voice X: Uvula elevates symmetrically XI: Shoulder shrug is symmetric. XII: tongue is midline without atrophy or fasciculations.  Motor: Good effort thorughout, at Least 5/5 bilateral UE, 5/5 bilateral lower extremity. Tremor noted with extremity movement.  Sensory: Sensation is grossly intact bilateral UEs & LEs    Labs on Admission: I have personally reviewed following labs and imaging studies  CBC: Recent Labs  Lab 08/16/22 1144  WBC 15.9*  HGB 14.4  HCT 42.0  MCV 93.1  PLT 312    Basic Metabolic Panel: Recent Labs  Lab 08/16/22 1144  NA 131*  K 4.4  CL 97*  CO2 20*  GLUCOSE 100*  BUN 24*  CREATININE 1.12  CALCIUM 9.4    GFR: Estimated Creatinine Clearance: 45.8 mL/min (by C-G formula based on SCr of 1.12 mg/dL).  Liver Function Tests: No results for input(s): "AST", "ALT", "ALKPHOS", "BILITOT", "PROT", "ALBUMIN" in the last 168 hours.  Urine analysis:    Component Value Date/Time   COLORURINE YELLOW 08/16/2022 1140    APPEARANCEUR CLEAR 08/16/2022 1140   LABSPEC 1.013 08/16/2022 1140   PHURINE 5.0 08/16/2022 1140   GLUCOSEU NEGATIVE 08/16/2022 1140   HGBUR SMALL (A) 08/16/2022 1140   BILIRUBINUR NEGATIVE 08/16/2022 1140   KETONESUR NEGATIVE 08/16/2022 1140   PROTEINUR NEGATIVE 08/16/2022 1140   NITRITE NEGATIVE 08/16/2022 1140   LEUKOCYTESUR NEGATIVE 08/16/2022 1140    Radiological Exams on Admission: CT Head Wo Contrast  Result Date: 08/16/2022 CLINICAL DATA:  Head trauma EXAM: CT HEAD WITHOUT CONTRAST CT CERVICAL SPINE WITHOUT CONTRAST TECHNIQUE: Multidetector CT imaging of the head and cervical spine was performed following the standard protocol without intravenous contrast. Multiplanar CT  image reconstructions of the cervical spine were also generated. RADIATION DOSE REDUCTION: This exam was performed according to the departmental dose-optimization program which includes automated exposure control, adjustment of the mA and/or kV according to patient size and/or use of iterative reconstruction technique. COMPARISON:  04/09/2020 FINDINGS: CT HEAD FINDINGS Brain: No evidence of acute infarction, hemorrhage, hydrocephalus, extra-axial collection or mass lesion/mass effect. Periventricular and deep white matter hypodensity. Nonacute lacunar infarctions of the bilateral basal ganglia. Vascular: No hyperdense vessel or unexpected calcification. Skull: Normal. Negative for fracture or focal lesion. Sinuses/Orbits: No acute finding. Other: None. CT CERVICAL SPINE FINDINGS Alignment: Degenerative straightening of the normal cervical lordosis. Skull base and vertebrae: No acute fracture. No primary bone lesion or focal pathologic process. Soft tissues and spinal canal: No prevertebral fluid or swelling. No visible canal hematoma. 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 Upper chest: Negative. Other: None. IMPRESSION: 1. No acute intracranial pathology. 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 2. No fracture or static subluxation of the cervical spine. 3. 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 Electronically Signed   By: Jearld Lesch M.D.   On: 08/16/2022 14:49   CT Cervical Spine Wo Contrast  Result Date: 08/16/2022 CLINICAL DATA:  Head trauma EXAM: CT HEAD WITHOUT CONTRAST CT CERVICAL SPINE WITHOUT CONTRAST TECHNIQUE: Multidetector CT imaging of the head and cervical spine was performed following the standard protocol without intravenous contrast. Multiplanar CT image reconstructions of the cervical spine were also generated. RADIATION DOSE REDUCTION: This exam was performed according to the departmental dose-optimization program which includes automated exposure control, adjustment of the mA and/or kV according to patient size and/or use of iterative reconstruction technique. COMPARISON:  04/09/2020 FINDINGS: CT HEAD FINDINGS Brain: No evidence of acute infarction, hemorrhage, hydrocephalus, extra-axial collection or mass lesion/mass effect. Periventricular and deep white matter hypodensity. Nonacute lacunar infarctions of the bilateral basal ganglia. Vascular: No hyperdense vessel or unexpected calcification. Skull: Normal. Negative for fracture or focal lesion. Sinuses/Orbits: No acute finding. Other: None. CT CERVICAL SPINE FINDINGS Alignment: Degenerative straightening of the normal cervical lordosis. Skull base and vertebrae: No acute fracture. No primary bone lesion or focal pathologic process. Soft tissues and spinal canal: No prevertebral fluid or swelling. No visible canal hematoma. 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 Upper chest: Negative. Other: None. IMPRESSION: 1. No acute intracranial pathology. 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 2. No fracture or static  subluxation of the cervical spine. 3. 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 Electronically Signed   By: Jearld Lesch M.D.   On: 08/16/2022 14:49    EKG: Independently reviewed.  Sinus tachycardia at 106 bpm.  Intraventricular conduction delay with QRS of 140 with appearance of left bundle branch block.  T wave abnormalities with J-point elevation in anterior leads and T wave inversion in inferior leads possibly consistent with LVH and repolarization abnormality.  Similar to EKG in 2022.  Assessment/Plan Principal Problem:   Acute encephalopathy Active Problems:   Mixed hyperlipidemia   Essential hypertension   Acute encephalopathy Alcohol use Hallucinations Leukocytosis > Patient presenting with intermittent confusion after a fall a week and a half ago.  Worsened last 2 to 3 days.  Now with hallucinations and seeing people around there. > Found down at home by his sister when she went to check on him today.  She also  reports he has had some weight loss recently and is concerned about his nutrition. >  does drink alcohol, beer, denies heavy use but unclear.  Ethanol level negative in the ED.  With his weight loss and concern for nutritional status was started on IV thiamine with concern for Warnicke's in the ED. > Does have leukocytosis to 15.9 without fever or  obvious infectious source.  Urinalysis with hemoglobin and rare bacteria only.  Will check chest x-ray. - Monitor on telemetry - Continue with CIWA with Ativan - Continue IV thiamine, p.o. folate, p.o. multivitamin - Check chest x-ray - Trend fever curve and WBC - Follow-up CK, UDS, ammonia - Check TSH, magnesium, phosphorus, B1(thiamine), B12, LFTs - If workup negative and not suspicious for withdrawal may need psychiatric consultation considering new hallucinations  Weakness Falls - Work-up as above - PT/OT eval and treat  Hypertension - Continue home amlodipine - Hold home ARB for  now  Hyperlipidemia - Continue home statin  DVT prophylaxis: Lovenox Code Status:   Full Family Communication:  Updated at bedside  Disposition Plan:   Patient is from:  Home  Anticipated DC to:  Home  Anticipated DC date:  1 to 4 days  Anticipated DC barriers: None  Consults called:  None Admission status:  Observation, telemetry  Severity of Illness: The appropriate patient status for this patient is OBSERVATION. Observation status is judged to be reasonable and necessary in order to provide the required intensity of service to ensure the patient's safety. The patient's presenting symptoms, physical exam findings, and initial radiographic and laboratory data in the context of their medical condition is felt to place them at decreased risk for further clinical deterioration. Furthermore, it is anticipated that the patient will be medically stable for discharge from the hospital within 2 midnights of admission.    Synetta Fail MD [facility]  How to contact the [facility] Attending or Consulting provider 7A - 7P or covering provider during after hours 7P -7A, for this patient?   Check the care team in [facility] and look for a) attending/consulting [facility] provider listed and b) the [facility] team listed Log into [email or web address] and use Welcome's universal password to access. If you do not have the password, please contact the hospital operator. Locate the [facility] provider you are looking for under [facility] and page to a number that you can be directly reached. If you still have difficulty reaching the provider, please page the [facility] (Director on Call) for the Hospitalists listed on amion for assistance.  08/16/2022, 4:08 PM

## 2022-08-16 NOTE — ED Notes (Signed)
Rainbow blood tubes sent down to lab and istat put in mini lab

## 2022-08-16 NOTE — ED Notes (Signed)
Pt requesting nicoderm patch. Pt has not used one today and wanted to go outside to have a cigarette.

## 2022-08-16 NOTE — Plan of Care (Signed)

## 2022-08-16 NOTE — ED Triage Notes (Signed)
Pt bib PTAR from home after his sister Gary Mathis went to check on him and found him on the floor. The furniture was moved around and the pt was very difficult to get off the floor. Pt states he does not remember falling today and ate breakfast and went about his normal routine. Sister states she did not see any sign of this. Pt has been having increased confusion over the past 2 months and is AOx3 on arrival. Pt sister says that he was fine yesterday evening when she talked to him but can not give a time. Pt complains of left leg pain and weakness. No obvious injuries or deformities on arrival.

## 2022-08-16 NOTE — ED Notes (Signed)
This paramedic spoke with lab and added on urine drug screen

## 2022-08-16 NOTE — ED Provider Notes (Signed)
[facility] AT [facility] Provider Note   CSN: [id number] Arrival date & time: 08/16/22  1118     History  Chief Complaint  Patient presents with   Weakness    Gary Mathis is a 67 y.o. male.  67 year old male with a history of hypertension, hyperlipidemia, and alcohol use who presents to the emergency department with altered mental status.  Patient brought in by [facility] after his sister went to check on him and found him on the floor.  Sister reports that he was found on the floor with his door being open.  Was talking about seeing people in the house that were not there.  Says that he did have a fall 1.5 weeks ago and has been intermittently confused since then.  Says that over the past few days it has been worse.  Does live at home alone.  Drinks beer but does not believe it is heavily.  Patient denies any heavy alcohol use to me recently.  Occasionally smokes marijuana denies any other drug use.  Also having left knee pain after his fall.  Denies any psychiatric history.  Sister is concerned that he is not eating enough since he has lost significant weight recently and is not staying well-hydrated.       Home Medications Prior to Admission medications   Medication Sig Start Date End Date Taking? Authorizing Provider  acetaminophen (TYLENOL) 325 MG tablet Take 2 tablets (650 mg total) by mouth every 4 (four) hours as needed for mild pain or fever. 04/10/20   Diamantina Monks, MD  amLODipine (NORVASC) 10 MG tablet Take 10 mg by mouth daily.    [provider]  docusate sodium (COLACE) 100 MG capsule Take 1 capsule (100 mg total) by mouth 2 (two) times daily. 04/10/20   Diamantina Monks, MD  Ibuprofen-Diphenhydramine Cit (ADVIL PM PO) Take 2 tablets by mouth once. sleep    [provider]  meloxicam (MOBIC) 7.5 MG tablet Take 7.5 mg by mouth daily as needed for pain. 12/21/19   [provider]  methocarbamol (ROBAXIN) 500 MG tablet  Take 1 tablet (500 mg total) by mouth every 8 (eight) hours as needed for muscle spasms. 04/10/20   Diamantina Monks, MD  oxyCODONE (OXY IR/ROXICODONE) 5 MG immediate release tablet Take 1 tablet (5 mg total) by mouth every 4 (four) hours as needed for severe pain or moderate pain (5mg  for moderate pain, 10mg  for severe pain). 04/10/20   Diamantina Monks, MD  vitamin B-12 (CYANOCOBALAMIN) 100 MCG tablet Take 100 mcg by mouth daily.    [provider]  vitamin C (ASCORBIC ACID) 500 MG tablet Take 500 mg by mouth daily.    [provider]      Allergies    Patient has no known allergies.    Review of Systems   Review of Systems  Physical Exam Updated Vital Signs BP 132/69   Pulse 89   Temp 98.3 F (36.8 C) (Oral)   Resp 20   Ht 5\' 11"  (1.803 m)   Wt 49.9 kg   SpO2 96%   BMI 15.34 kg/m  Physical Exam Vitals and nursing note reviewed.  Constitutional:      General: He is not in acute distress.    Appearance: He is well-developed.  HENT:     Head: Normocephalic and atraumatic.     Right Ear: External ear normal.     Left Ear: External ear normal.  Nose: Nose normal.  Eyes:     Extraocular Movements: Extraocular movements intact.     Conjunctiva/sclera: Conjunctivae normal.     Pupils: Pupils are equal, round, and reactive to light.  Cardiovascular:     Rate and Rhythm: Normal rate and regular rhythm.     Heart sounds: Normal heart sounds.  Pulmonary:     Effort: Pulmonary effort is normal. No respiratory distress.     Breath sounds: Normal breath sounds.  Abdominal:     General: There is no distension.     Palpations: Abdomen is soft. There is no mass.     Tenderness: There is no abdominal tenderness. There is no guarding.  Musculoskeletal:     Cervical back: Normal range of motion and neck supple.     Right lower leg: No edema.     Left lower leg: No edema.     Comments: Abrasion on left knee with some mild tenderness to palpation.  Skin:    General:  Skin is warm and dry.  Neurological:     Mental Status: He is alert. Mental status is at baseline.     Comments: Alert and oriented to self and place but not year.  Cranial nerves II through XII intact.  Full strength of all 4 extremities.  Intact sensation to light touch.  Has difficulty complying with remainder of neuroexam.  Psychiatric:        Mood and Affect: Mood normal.        Behavior: Behavior normal.     ED Results / Procedures / Treatments   Labs (all labs ordered are listed, but only abnormal results are displayed) Labs Reviewed  BASIC METABOLIC PANEL - Abnormal; Notable for the following components:      Result Value   Sodium 131 (*)    Chloride 97 (*)    CO2 20 (*)    Glucose, Bld 100 (*)    BUN 24 (*)    All other components within normal limits  CBC - Abnormal; Notable for the following components:   WBC 15.9 (*)    All other components within normal limits  URINALYSIS, ROUTINE W REFLEX MICROSCOPIC - Abnormal; Notable for the following components:   Hgb urine dipstick SMALL (*)    Bacteria, UA RARE (*)    All other components within normal limits  AMMONIA  ETHANOL  RAPID URINE DRUG SCREEN, HOSP PERFORMED  CBG MONITORING, ED    EKG EKG Interpretation Date/Time:  Saturday August 16 2022 11:34:52 EDT Ventricular Rate:  106 PR Interval:  130 QRS Duration:  140 QT Interval:  362 QTC Calculation: 480 R Axis:   75  Text Interpretation: Sinus tachycardia Non-specific intra-ventricular conduction block Minimal voltage criteria for LVH, may be normal variant ( Sokolow-Lyon ) Marked T-wave abnormality, consider inferolateral ischemia Abnormal ECG No previous ECGs available Confirmed by Vonita Moss [phone]) on 08/16/2022 1:35:41 PM  Radiology CT Head Wo Contrast  Result Date: 08/16/2022 CLINICAL DATA:  Head trauma EXAM: CT HEAD WITHOUT CONTRAST CT CERVICAL SPINE WITHOUT CONTRAST TECHNIQUE: Multidetector CT imaging of the head and cervical spine was performed  following the standard protocol without intravenous contrast. Multiplanar CT image reconstructions of the cervical spine were also generated. RADIATION DOSE REDUCTION: This exam was performed according to the departmental dose-optimization program which includes automated exposure control, adjustment of the mA and/or kV according to patient size and/or use of iterative reconstruction technique. COMPARISON:  04/09/2020 FINDINGS: CT HEAD FINDINGS Brain: No evidence of acute infarction,  hemorrhage, hydrocephalus, extra-axial collection or mass lesion/mass effect. Periventricular and deep white matter hypodensity. Nonacute lacunar infarctions of the bilateral basal ganglia. Vascular: No hyperdense vessel or unexpected calcification. Skull: Normal. Negative for fracture or focal lesion. Sinuses/Orbits: No acute finding. Other: None. CT CERVICAL SPINE FINDINGS Alignment: Degenerative straightening of the normal cervical lordosis. Skull base and vertebrae: No acute fracture. No primary bone lesion or focal pathologic process. Soft tissues and spinal canal: No prevertebral fluid or swelling. No visible canal hematoma. 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 Upper chest: Negative. Other: None. IMPRESSION: 1. No acute intracranial pathology. 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 2. No fracture or static subluxation of the cervical spine. 3. 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 Electronically Signed   By: Jearld Lesch M.D.   On: 08/16/2022 14:49   CT Cervical Spine Wo Contrast  Result Date: 08/16/2022 CLINICAL DATA:  Head trauma EXAM: CT HEAD WITHOUT CONTRAST CT CERVICAL SPINE WITHOUT CONTRAST TECHNIQUE: Multidetector CT imaging of the head and cervical spine was performed following the standard protocol without intravenous contrast. Multiplanar CT image reconstructions of the  cervical spine were also generated. RADIATION DOSE REDUCTION: This exam was performed according to the departmental dose-optimization program which includes automated exposure control, adjustment of the mA and/or kV according to patient size and/or use of iterative reconstruction technique. COMPARISON:  04/09/2020 FINDINGS: CT HEAD FINDINGS Brain: No evidence of acute infarction, hemorrhage, hydrocephalus, extra-axial collection or mass lesion/mass effect. Periventricular and deep white matter hypodensity. Nonacute lacunar infarctions of the bilateral basal ganglia. Vascular: No hyperdense vessel or unexpected calcification. Skull: Normal. Negative for fracture or focal lesion. Sinuses/Orbits: No acute finding. Other: None. CT CERVICAL SPINE FINDINGS Alignment: Degenerative straightening of the normal cervical lordosis. Skull base and vertebrae: No acute fracture. No primary bone lesion or focal pathologic process. Soft tissues and spinal canal: No prevertebral fluid or swelling. No visible canal hematoma. 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 Upper chest: Negative. Other: None. IMPRESSION: 1. No acute intracranial pathology. 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 2. No fracture or static subluxation of the cervical spine. 3. 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 Electronically Signed   By: Jearld Lesch M.D.   On: 08/16/2022 14:49    Procedures Procedures    Medications Ordered in ED Medications  LORazepam (ATIVAN) tablet 1-4 mg (has no administration in time range)  folic acid (FOLVITE) tablet 1 mg (has no administration in time range)  multivitamin with minerals tablet 1 tablet (has no administration in time range)  thiamine (VITAMIN B1) 500 mg in sodium chloride 0.9 % 50 mL IVPB (has no administration in time range)  nicotine (NICODERM CQ - dosed in mg/24  hours) patch 14 mg (has no administration in time range)  nicotine polacrilex (NICORETTE) gum 2 mg (has no administration in time range)  lactated ringers bolus 1,000 mL (1,000 mLs Intravenous New Bag/Given 08/16/22 1505)    ED Course/ Medical Decision Making/ A&P Clinical Course as of 08/16/22 1541  Sat Aug 16, 2022  1538 Signed to Dr Rodena Medin [RP]    Clinical Course User Index [RP] Rondel Baton, MD                             Medical Decision Making Amount and/or Complexity of  Data Reviewed Labs: ordered. Radiology: ordered.  Risk OTC drugs. Prescription drug management. Decision regarding hospitalization.   Gary Mathis is a 67 y.o. male with comorbidities that complicate the patient evaluation including hypertension, hyperlipidemia, and alcohol use who presents to the emergency department with altered mental status.    Initial Ddx:  TBI, C-spine injury, intoxication, alcohol withdrawals, psychosis, Warnicke's encephalopathy, knee fracture  MDM/Course:  With patient's altered mental status and hallucinations concerned that he is cephalopathic.  May be Warnicke's encephalopathy due to his poor nutrition and history of alcohol use.  Will go ahead and treat him as such.  No heavy alcohol use but will monitor him via CIWA for any severe withdrawals.  No prior psychiatric history.  CT of the head and C-spine were obtained given his fall which did not show any evidence of injury.  Ethanol and drug screen were sent and are pending at this time.  X-rays of his knee obtained as well to evaluate for fracture.  Upon re-evaluation patient persistently altered.  Will admit to hospital for further management. Signed out to Dr Rodena Medin awaiting call back from the hospitalist.   This patient presents to the ED for concern of complaints listed in HPI, this involves an extensive number of treatment options, and is a complaint that carries with it a high risk of complications and morbidity.  Disposition including potential need for admission considered.   Dispo: Admit to Floor  Additional history obtained from family Records reviewed Outpatient Clinic Notes The following labs were independently interpreted: Chemistry and show CKD I independently reviewed the following imaging with scope of interpretation limited to determining acute life threatening conditions related to emergency care: CT Head and agree with the radiologist interpretation with the following exceptions: none I personally reviewed and interpreted cardiac monitoring: normal sinus rhythm  I personally reviewed and interpreted the pt's EKG: see above for interpretation  I have reviewed the patients home medications and made adjustments as needed Consults: Hospitalist Social Determinants of health:  Alcohol use         Final Clinical Impression(s) / ED Diagnoses Final diagnoses:  Generalized weakness  Disorientation    Rx / DC Orders ED Discharge Orders     None         Rondel Baton, MD 08/16/22 1541

## 2022-08-16 NOTE — ED Notes (Signed)
ED TO INPATIENT HANDOFF REPORT  ED Nurse Name and Phone #:  Dot Lanes, paramedic [phone]  S Name/Age/Gender Gary Mathis 67 y.o. male Room/Bed: 020C/020C  Code Status   Code Status: Full Code  Home/SNF/Other Home Patient oriented to: self and place Is this baseline? No   Triage Complete: Triage complete  Chief Complaint Acute encephalopathy [G93.40]  Triage Note Pt bib PTAR from home after his sister Vear Clock went to check on him and found him on the floor. The furniture was moved around and the pt was very difficult to get off the floor. Pt states he does not remember falling today and ate breakfast and went about his normal routine. Sister states she did not see any sign of this. Pt has been having increased confusion over the past 2 months and is AOx3 on arrival. Pt sister says that he was fine yesterday evening when she talked to him but can not give a time. Pt complains of left leg pain and weakness. No obvious injuries or deformities on arrival.   Allergies No Known Allergies  Level of Care/Admitting Diagnosis ED Disposition     ED Disposition  Admit   Condition  --   Comment  Hospital Area: [facility] [[id number]]  Level of Care: Telemetry Medical [104]  May place patient in observation at [facility] or [facility] if equivalent level of care is available:: No  Covid Evaluation: Asymptomatic - no recent exposure (last 10 days) testing not required  Diagnosis: Acute encephalopathy [621308]  Admitting Physician: Synetta Fail [[id number]]  Attending Physician: Synetta Fail [[id number]]          B Medical/Surgery History No past medical history on file. Past Surgical History:  Procedure Laterality Date   ANKLE FRACTURE SURGERY     metal plate with 9 scews     A IV Location/Drains/Wounds Patient Lines/Drains/Airways Status     Active Line/Drains/Airways     Name Placement date Placement time Site Days   Peripheral IV 08/16/22 18 G  Anterior;Proximal;Right Forearm 08/16/22  1143  Forearm  less than 1            Intake/Output Last 24 hours No intake or output data in the 24 hours ending 08/16/22 1617  Labs/Imaging Results for orders placed or performed during the hospital encounter of 08/16/22 (from the past 48 hour(s))  Urinalysis, Routine w reflex microscopic -Urine, Clean Catch     Status: Abnormal   Collection Time: 08/16/22 11:40 AM  Result Value Ref Range   Color, Urine YELLOW YELLOW   APPearance CLEAR CLEAR   Specific Gravity, Urine 1.013 1.005 - 1.030   pH 5.0 5.0 - 8.0   Glucose, UA NEGATIVE NEGATIVE mg/dL   Hgb urine dipstick SMALL (A) NEGATIVE   Bilirubin Urine NEGATIVE NEGATIVE   Ketones, ur NEGATIVE NEGATIVE mg/dL   Protein, ur NEGATIVE NEGATIVE mg/dL   Nitrite NEGATIVE NEGATIVE   Leukocytes,Ua NEGATIVE NEGATIVE   RBC / HPF 0-5 0 - 5 RBC/hpf   WBC, UA 0-5 0 - 5 WBC/hpf   Bacteria, UA RARE (A) NONE SEEN   Squamous Epithelial / HPF 0-5 0 - 5 /HPF   Mucus PRESENT    Hyaline Casts, UA PRESENT     Comment: Performed at [facility], [address], [place], [place] [address]  Rapid urine drug screen (hospital performed)     Status: None   Collection Time: 08/16/22 11:40 AM  Result Value Ref Range   Opiates  NONE DETECTED NONE DETECTED   Cocaine NONE DETECTED NONE DETECTED   Benzodiazepines NONE DETECTED NONE DETECTED   Amphetamines NONE DETECTED NONE DETECTED   Tetrahydrocannabinol NONE DETECTED NONE DETECTED   Barbiturates NONE DETECTED NONE DETECTED    Comment: (NOTE) DRUG SCREEN FOR MEDICAL PURPOSES ONLY.  IF CONFIRMATION IS NEEDED FOR ANY PURPOSE, NOTIFY LAB WITHIN 5 DAYS.  LOWEST DETECTABLE LIMITS FOR URINE DRUG SCREEN Drug Class                     Cutoff (ng/mL) Amphetamine and metabolites    1000 Barbiturate and metabolites    200 Benzodiazepine                 200 Opiates and metabolites        300 Cocaine and metabolites        300 THC                             50 Performed at [facility], [address], [place], [place] [address]   Basic metabolic panel     Status: Abnormal   Collection Time: 08/16/22 11:44 AM  Result Value Ref Range   Sodium 131 (L) 135 - 145 mmol/L   Potassium 4.4 3.5 - 5.1 mmol/L   Chloride 97 (L) 98 - 111 mmol/L   CO2 20 (L) 22 - 32 mmol/L   Glucose, Bld 100 (H) 70 - 99 mg/dL    Comment: Glucose reference range applies only to samples taken after fasting for at least 8 hours.   BUN 24 (H) 8 - 23 mg/dL   Creatinine, Ser 0.10 0.61 - 1.24 mg/dL   Calcium 9.4 8.9 - 27.2 mg/dL   GFR, Estimated >53 >66 mL/min    Comment: (NOTE) Calculated using the CKD-EPI Creatinine Equation (2021)    Anion gap 14 5 - 15    Comment: Performed at [facility], [address] [place], [place] [address]  CBC     Status: Abnormal   Collection Time: 08/16/22 11:44 AM  Result Value Ref Range   WBC 15.9 (H) 4.0 - 10.5 K/uL   RBC 4.51 4.22 - 5.81 MIL/uL   Hemoglobin 14.4 13.0 - 17.0 g/dL   HCT 74.2 59.5 - 63.8 %   MCV 93.1 80.0 - 100.0 fL   MCH 31.9 26.0 - 34.0 pg   MCHC 34.3 30.0 - 36.0 g/dL   RDW 75.6 43.3 - 29.5 %   Platelets 312 150 - 400 K/uL   nRBC 0.0 0.0 - 0.2 %    Comment: Performed at [facility], [address] [place], [place] [address]  CBG monitoring, ED     Status: None   Collection Time: 08/16/22 11:49 AM  Result Value Ref Range   Glucose-Capillary 99 70 - 99 mg/dL    Comment: Glucose reference range applies only to samples taken after fasting for at least 8 hours.  Ammonia     Status: None   Collection Time: 08/16/22  2:53 PM  Result Value Ref Range   Ammonia 35 9 - 35 umol/L    Comment: HEMOLYSIS AT THIS LEVEL MAY AFFECT RESULT Performed at [facility], [address], [place], [place] [address]   Ethanol     Status: None   Collection Time: 08/16/22  2:53 PM  Result Value Ref Range   Alcohol, Ethyl (B) <10 <10 mg/dL  Comment: (NOTE) Lowest detectable limit for serum alcohol is 10  mg/dL.  For medical purposes only. Performed at [facility], [address] [place], [place] [address]    DG Knee Complete 4 Views Left  Result Date: 08/16/2022 CLINICAL DATA:  Left knee pain. EXAM: LEFT KNEE - COMPLETE 4+ VIEW COMPARISON:  None Available. FINDINGS: No evidence of fracture, dislocation, or joint effusion. No evidence of arthropathy or other focal bone abnormality. Arterial vascular calcifications. Soft tissues are unremarkable. IMPRESSION: 1. Peripheral vascular disease. 2. Otherwise unremarkable radiographic appearance of the left knee. Electronically Signed   By: Narda Rutherford M.D.   On: 08/16/2022 16:12   CT Head Wo Contrast  Result Date: 08/16/2022 CLINICAL DATA:  Head trauma EXAM: CT HEAD WITHOUT CONTRAST CT CERVICAL SPINE WITHOUT CONTRAST TECHNIQUE: Multidetector CT imaging of the head and cervical spine was performed following the standard protocol without intravenous contrast. Multiplanar CT image reconstructions of the cervical spine were also generated. RADIATION DOSE REDUCTION: This exam was performed according to the departmental dose-optimization program which includes automated exposure control, adjustment of the mA and/or kV according to patient size and/or use of iterative reconstruction technique. COMPARISON:  04/09/2020 FINDINGS: CT HEAD FINDINGS Brain: No evidence of acute infarction, hemorrhage, hydrocephalus, extra-axial collection or mass lesion/mass effect. Periventricular and deep white matter hypodensity. Nonacute lacunar infarctions of the bilateral basal ganglia. Vascular: No hyperdense vessel or unexpected calcification. Skull: Normal. Negative for fracture or focal lesion. Sinuses/Orbits: No acute finding. Other: None. CT CERVICAL SPINE FINDINGS Alignment: Degenerative straightening of the normal cervical lordosis. Skull base and vertebrae: No acute fracture. No primary bone lesion or focal pathologic process. Soft tissues and spinal canal: No  prevertebral fluid or swelling. No visible canal hematoma. 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 Upper chest: Negative. Other: None. IMPRESSION: 1. No acute intracranial pathology. 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 2. No fracture or static subluxation of the cervical spine. 3. 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 Electronically Signed   By: Jearld Lesch M.D.   On: 08/16/2022 14:49   CT Cervical Spine Wo Contrast  Result Date: 08/16/2022 CLINICAL DATA:  Head trauma EXAM: CT HEAD WITHOUT CONTRAST CT CERVICAL SPINE WITHOUT CONTRAST TECHNIQUE: Multidetector CT imaging of the head and cervical spine was performed following the standard protocol without intravenous contrast. Multiplanar CT image reconstructions of the cervical spine were also generated. RADIATION DOSE REDUCTION: This exam was performed according to the departmental dose-optimization program which includes automated exposure control, adjustment of the mA and/or kV according to patient size and/or use of iterative reconstruction technique. COMPARISON:  04/09/2020 FINDINGS: CT HEAD FINDINGS Brain: No evidence of acute infarction, hemorrhage, hydrocephalus, extra-axial collection or mass lesion/mass effect. Periventricular and deep white matter hypodensity. Nonacute lacunar infarctions of the bilateral basal ganglia. Vascular: No hyperdense vessel or unexpected calcification. Skull: Normal. Negative for fracture or focal lesion. Sinuses/Orbits: No acute finding. Other: None. CT CERVICAL SPINE FINDINGS Alignment: Degenerative straightening of the normal cervical lordosis. Skull base and vertebrae: No acute fracture. No primary bone lesion or focal pathologic process. Soft tissues and spinal canal: No prevertebral fluid or swelling. No visible canal hematoma. Disc levels: Severe multilevel disc degenerative  disease throughout the cervical spine. Upper chest: Negative. Other: None. IMPRESSION: 1. No acute intracranial pathology. 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 2. No fracture or static subluxation of the  cervical spine. 3. 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 Electronically Signed   By: Jearld Lesch M.D.   On: 08/16/2022 14:49    Pending Labs Unresulted Labs (From admission, onward)     Start     Ordered   08/23/22 0500  Creatinine, serum  (enoxaparin (LOVENOX)    CrCl >/= 30 ml/min)  Weekly,   R     Comments: while on enoxaparin therapy    08/16/22 1600   08/17/22 0500  Comprehensive metabolic panel  Tomorrow morning,   R        08/16/22 1600   08/17/22 0500  CBC  Tomorrow morning,   R        08/16/22 1600   08/16/22 1618  Phosphorus  Add-on,   AD        08/16/22 1617   08/16/22 1601  Vitamin B1  Add-on,   AD        08/16/22 1600   08/16/22 1601  Magnesium  Add-on,   AD        08/16/22 1600   08/16/22 1600  TSH  Add-on,   AD        08/16/22 1600   08/16/22 1556  HIV Antibody (routine testing w rflx)  (HIV Antibody (Routine testing w reflex) panel)  Once,   R        08/16/22 1600   08/16/22 1542  CK  Add-on,   AD        08/16/22 1542            Vitals/Pain Today's Vitals   08/16/22 1328 08/16/22 1400 08/16/22 1600 08/16/22 1600  BP:  132/69  130/78  Pulse:  89  93  Resp:  20    Temp:   98 F (36.7 C)   TempSrc:   Oral   SpO2:  96%    Weight:      Height:      PainSc: 0-No pain       Isolation Precautions No active isolations  Medications Medications  LORazepam (ATIVAN) tablet 1-4 mg (has no administration in time range)  folic acid (FOLVITE) tablet 1 mg (1 mg Oral Given 08/16/22 1551)  multivitamin with minerals tablet 1 tablet (1 tablet Oral Given 08/16/22 1551)  thiamine (VITAMIN B1) 500 mg in sodium chloride 0.9 % 50 mL IVPB (500 mg Intravenous New Bag/Given  08/16/22 1559)  nicotine (NICODERM CQ - dosed in mg/24 hours) patch 14 mg (14 mg Transdermal Patch Applied 08/16/22 1615)  nicotine polacrilex (NICORETTE) gum 2 mg (has no administration in time range)  amLODipine (NORVASC) tablet 10 mg (has no administration in time range)  enoxaparin (LOVENOX) injection 40 mg (has no administration in time range)  sodium chloride flush (NS) 0.9 % injection 3 mL (has no administration in time range)  acetaminophen (TYLENOL) tablet 650 mg (has no administration in time range)    Or  acetaminophen (TYLENOL) suppository 650 mg (has no administration in time range)  polyethylene glycol (MIRALAX / GLYCOLAX) packet 17 g (has no administration in time range)  lactated ringers bolus 1,000 mL (1,000 mLs Intravenous New Bag/Given 08/16/22 1505)    Mobility Walks without assistance normally but lately unable to do that      Focused Assessments     R Recommendations: See Admitting Provider Note  Report given to:   Additional Notes:

## 2022-08-17 ENCOUNTER — Inpatient Hospital Stay (HOSPITAL_COMMUNITY): Payer: Medicare HMO

## 2022-08-17 DIAGNOSIS — R441 Visual hallucinations: Secondary | ICD-10-CM

## 2022-08-17 DIAGNOSIS — F05 Delirium due to known physiological condition: Secondary | ICD-10-CM | POA: Diagnosis present

## 2022-08-17 DIAGNOSIS — F17210 Nicotine dependence, cigarettes, uncomplicated: Secondary | ICD-10-CM | POA: Diagnosis present

## 2022-08-17 DIAGNOSIS — Z833 Family history of diabetes mellitus: Secondary | ICD-10-CM | POA: Diagnosis not present

## 2022-08-17 DIAGNOSIS — F0392 Unspecified dementia, unspecified severity, with psychotic disturbance: Secondary | ICD-10-CM | POA: Diagnosis present

## 2022-08-17 DIAGNOSIS — E782 Mixed hyperlipidemia: Secondary | ICD-10-CM | POA: Diagnosis present

## 2022-08-17 DIAGNOSIS — Z515 Encounter for palliative care: Secondary | ICD-10-CM | POA: Diagnosis not present

## 2022-08-17 DIAGNOSIS — E876 Hypokalemia: Secondary | ICD-10-CM | POA: Diagnosis not present

## 2022-08-17 DIAGNOSIS — F1010 Alcohol abuse, uncomplicated: Secondary | ICD-10-CM | POA: Diagnosis not present

## 2022-08-17 DIAGNOSIS — D72829 Elevated white blood cell count, unspecified: Secondary | ICD-10-CM | POA: Diagnosis not present

## 2022-08-17 DIAGNOSIS — E512 Wernicke's encephalopathy: Secondary | ICD-10-CM | POA: Diagnosis present

## 2022-08-17 DIAGNOSIS — Z79899 Other long term (current) drug therapy: Secondary | ICD-10-CM | POA: Diagnosis not present

## 2022-08-17 DIAGNOSIS — I10 Essential (primary) hypertension: Secondary | ICD-10-CM | POA: Diagnosis present

## 2022-08-17 DIAGNOSIS — M6282 Rhabdomyolysis: Secondary | ICD-10-CM | POA: Diagnosis not present

## 2022-08-17 DIAGNOSIS — F0282 Dementia in other diseases classified elsewhere, unspecified severity, with psychotic disturbance: Secondary | ICD-10-CM | POA: Diagnosis present

## 2022-08-17 DIAGNOSIS — R531 Weakness: Secondary | ICD-10-CM | POA: Diagnosis not present

## 2022-08-17 DIAGNOSIS — E43 Unspecified severe protein-calorie malnutrition: Secondary | ICD-10-CM | POA: Diagnosis present

## 2022-08-17 DIAGNOSIS — W19XXXA Unspecified fall, initial encounter: Secondary | ICD-10-CM | POA: Diagnosis present

## 2022-08-17 DIAGNOSIS — Y92009 Unspecified place in unspecified non-institutional (private) residence as the place of occurrence of the external cause: Secondary | ICD-10-CM | POA: Diagnosis not present

## 2022-08-17 DIAGNOSIS — E8809 Other disorders of plasma-protein metabolism, not elsewhere classified: Secondary | ICD-10-CM | POA: Diagnosis present

## 2022-08-17 DIAGNOSIS — Y900 Blood alcohol level of less than 20 mg/100 ml: Secondary | ICD-10-CM | POA: Diagnosis present

## 2022-08-17 DIAGNOSIS — Z681 Body mass index (BMI) 19 or less, adult: Secondary | ICD-10-CM | POA: Diagnosis not present

## 2022-08-17 DIAGNOSIS — F0390 Unspecified dementia without behavioral disturbance: Secondary | ICD-10-CM | POA: Diagnosis not present

## 2022-08-17 DIAGNOSIS — Z66 Do not resuscitate: Secondary | ICD-10-CM | POA: Diagnosis present

## 2022-08-17 DIAGNOSIS — G9340 Encephalopathy, unspecified: Secondary | ICD-10-CM | POA: Diagnosis not present

## 2022-08-17 DIAGNOSIS — R296 Repeated falls: Secondary | ICD-10-CM | POA: Diagnosis present

## 2022-08-17 DIAGNOSIS — F10239 Alcohol dependence with withdrawal, unspecified: Secondary | ICD-10-CM | POA: Diagnosis present

## 2022-08-17 DIAGNOSIS — R569 Unspecified convulsions: Secondary | ICD-10-CM | POA: Diagnosis not present

## 2022-08-17 DIAGNOSIS — Z7189 Other specified counseling: Secondary | ICD-10-CM | POA: Diagnosis not present

## 2022-08-17 DIAGNOSIS — Z5941 Food insecurity: Secondary | ICD-10-CM | POA: Diagnosis not present

## 2022-08-17 DIAGNOSIS — R410 Disorientation, unspecified: Secondary | ICD-10-CM | POA: Diagnosis not present

## 2022-08-17 LAB — COMPREHENSIVE METABOLIC PANEL
ALT: 29 U/L (ref 0–44)
AST: 50 U/L — ABNORMAL HIGH (ref 15–41)
Albumin: 3.4 g/dL — ABNORMAL LOW (ref 3.5–5.0)
Alkaline Phosphatase: 52 U/L (ref 38–126)
Anion gap: 6 (ref 5–15)
BUN: 16 mg/dL (ref 8–23)
CO2: 25 mmol/L (ref 22–32)
Calcium: 8.7 mg/dL — ABNORMAL LOW (ref 8.9–10.3)
Chloride: 103 mmol/L (ref 98–111)
Creatinine, Ser: 1.05 mg/dL (ref 0.61–1.24)
GFR, Estimated: >60 mL/min (ref >60)
Glucose, Bld: 96 mg/dL (ref 70–99)
Potassium: 3.5 mmol/L (ref 3.5–5.1)
Sodium: 134 mmol/L — ABNORMAL LOW (ref 135–145)
Total Bilirubin: 0.8 mg/dL (ref 0.3–1.2)
Total Protein: 5.8 g/dL — ABNORMAL LOW (ref 6.5–8.1)

## 2022-08-17 LAB — CBC
HCT: 38.1 % — ABNORMAL LOW (ref 39.0–52.0)
Hemoglobin: 12.8 g/dL — ABNORMAL LOW (ref 13.0–17.0)
MCH: 31.6 pg (ref 26.0–34.0)
MCHC: 33.6 g/dL (ref 30.0–36.0)
MCV: 94.1 fL (ref 80.0–100.0)
Platelets: 268 10*3/uL (ref 150–400)
RBC: 4.05 MIL/uL — ABNORMAL LOW (ref 4.22–5.81)
RDW: 13.2 % (ref 11.5–15.5)
WBC: 10.2 10*3/uL (ref 4.0–10.5)
nRBC: 0 % (ref 0.0–0.2)

## 2022-08-17 LAB — RPR: RPR Ser Ql: NONREACTIVE

## 2022-08-17 MED ORDER — CHLORDIAZEPOXIDE HCL 25 MG PO CAPS
25.0000 mg | ORAL_CAPSULE | Freq: Every day | ORAL | Status: AC
  Administered 2022-08-20: 25 mg via ORAL
  Filled 2022-08-17: qty 1

## 2022-08-17 MED ORDER — ASPIRIN 81 MG PO TBEC
81.0000 mg | DELAYED_RELEASE_TABLET | Freq: Every day | ORAL | Status: DC
  Administered 2022-08-17 – 2022-08-20 (×4): 81 mg via ORAL
  Filled 2022-08-17 (×4): qty 1

## 2022-08-17 MED ORDER — CHLORDIAZEPOXIDE HCL 25 MG PO CAPS
25.0000 mg | ORAL_CAPSULE | ORAL | Status: AC
  Administered 2022-08-19: 25 mg via ORAL
  Filled 2022-08-17 (×2): qty 1

## 2022-08-17 MED ORDER — ONDANSETRON 4 MG PO TBDP: 4.0000 mg | ORAL_TABLET | Freq: Four times a day (QID) | ORAL | Status: AC | PRN

## 2022-08-17 MED ORDER — SODIUM CHLORIDE 0.9 % IV SOLN: INTRAVENOUS | Status: AC

## 2022-08-17 MED ORDER — THIAMINE HCL 100 MG/ML IJ SOLN: 100.0000 mg | Freq: Once | INTRAMUSCULAR | Status: DC

## 2022-08-17 MED ORDER — CHLORDIAZEPOXIDE HCL 25 MG PO CAPS
25.0000 mg | ORAL_CAPSULE | Freq: Three times a day (TID) | ORAL | Status: AC
  Administered 2022-08-18 – 2022-08-19 (×2): 25 mg via ORAL
  Filled 2022-08-17 (×3): qty 1

## 2022-08-17 MED ORDER — PANTOPRAZOLE SODIUM 40 MG PO TBEC
40.0000 mg | DELAYED_RELEASE_TABLET | Freq: Every day | ORAL | Status: DC
  Administered 2022-08-17 – 2022-08-21 (×4): 40 mg via ORAL
  Filled 2022-08-17 (×6): qty 1

## 2022-08-17 MED ORDER — HYDROXYZINE HCL 25 MG PO TABS
25.0000 mg | ORAL_TABLET | Freq: Four times a day (QID) | ORAL | Status: AC | PRN
  Filled 2022-08-17: qty 1

## 2022-08-17 MED ORDER — ADULT MULTIVITAMIN W/MINERALS CH: 1.0000 | ORAL_TABLET | Freq: Every day | ORAL | Status: DC

## 2022-08-17 MED ORDER — LOPERAMIDE HCL 2 MG PO CAPS: 2.0000 mg | ORAL_CAPSULE | ORAL | Status: AC | PRN

## 2022-08-17 MED ORDER — VITAMIN D-3 25 MCG (1000 UT) PO CAPS: ORAL_CAPSULE | ORAL | Status: DC

## 2022-08-17 MED ORDER — ENSURE ENLIVE PO LIQD
237.0000 mL | Freq: Two times a day (BID) | ORAL | Status: DC
  Administered 2022-08-17 – 2022-08-20 (×6): 237 mL via ORAL

## 2022-08-17 MED ORDER — CHLORDIAZEPOXIDE HCL 25 MG PO CAPS
25.0000 mg | ORAL_CAPSULE | Freq: Four times a day (QID) | ORAL | Status: AC | PRN
  Administered 2022-08-18: 25 mg via ORAL

## 2022-08-17 MED ORDER — THIAMINE HCL 100 MG/ML IJ SOLN
500.0000 mg | Freq: Three times a day (TID) | INTRAVENOUS | Status: AC
  Administered 2022-08-17 – 2022-08-21 (×15): 500 mg via INTRAVENOUS
  Filled 2022-08-17 (×16): qty 5

## 2022-08-17 MED ORDER — CHLORDIAZEPOXIDE HCL 25 MG PO CAPS
25.0000 mg | ORAL_CAPSULE | Freq: Four times a day (QID) | ORAL | Status: AC
  Administered 2022-08-17 – 2022-08-18 (×4): 25 mg via ORAL
  Filled 2022-08-17 (×4): qty 1

## 2022-08-17 NOTE — Evaluation (Signed)
Physical Therapy Evaluation Patient Details Name: Gary Mathis MRN: [id number] DOB: Oct 21, 1955 Today's Date: 08/17/2022  History of Present Illness  Gary Mathis is a 67 y.o. male  presenting with weakness and altered mental status; Sister found him down; with medical history significant of hypertension, hyperlipidemia, low back pain, alcohol use  Clinical Impression   Pt admitted with above diagnosis. Lives at home alone, in a single-level home with a few steps to enter; Prior to admission, pt was managing independently; Presents to PT with persistent posterior lean and bias during upright activity, significantly high  fall risk, decr balance, decr functional independence;  Needs mod/max assist to walk short distances with RW; If his current impairments are related to withdrawal, it is possible that he will make a full recovery back to independence once done with withdrawal; it is also possible that he may be slow to progress, and will need post-acute rehab to maximize independence and safety with mobility and ADLs in order to get home; Pt currently with functional limitations due to the deficits listed below (see PT Problem List). Pt will benefit from skilled PT to increase their independence and safety with mobility to allow discharge to the venue listed below.           Assistance Recommended at Discharge Frequent or constant Supervision/Assistance  If plan is discharge home, recommend the following:  Can travel by private vehicle  A lot of help with walking and/or transfers;A lot of help with bathing/dressing/bathroom;Assistance with cooking/housework   No    Equipment Recommendations Rolling walker (2 wheels);BSC/3in1  Recommendations for Other Services       Functional Status Assessment Patient has had a recent decline in their functional status and demonstrates the ability to make significant improvements in function in a reasonable and predictable amount of time.      Precautions / Restrictions Precautions Precautions: Fall Restrictions Weight Bearing Restrictions: No      Mobility  Bed Mobility Overal bed mobility: Needs Assistance Bed Mobility: Supine to Sit, Sit to Supine     Supine to sit: Min guard Sit to supine: Min assist   General bed mobility comments: Got up to eOB wit HOB elevated; needed cues to finish task by scooting to EOB to get feet to teh floor; towards eh end of the session, pt became more restless; Used the bed tilt to trendelenburg feature to encourage pt to lay back down, min assist to help feet into bed    Transfers Overall transfer level: Needs assistance Equipment used: Rolling walker (2 wheels) Transfers: Sit to/from Stand Sit to Stand: Mod assist           General transfer comment: Heavy mod assist to help with anterior weight shifting and to power up; stands with very wide BOS    Ambulation/Gait Ambulation/Gait assistance: Mod assist, Max assist Gait Distance (Feet): 12 Feet Assistive device: Rolling walker (2 wheels) Gait Pattern/deviations: Decreased step length - right, Decreased step length - left, Wide base of support, Festinating       General Gait Details: short steps with wide base of support, and festinating-like steps; able to smooth out steps briefly with cues for slow, long steps; Constant need for Mod assist due to persistent osterior lean  Stairs            Wheelchair Mobility     Tilt Bed    Modified Rankin (Stroke Patients Only)       Balance Overall balance assessment: Needs assistance  Sitting balance-Leahy Scale: Fair       Standing balance-Leahy Scale: Zero Standing balance comment: persistent posterior lean; one instance of better balance with center of mass over base of support while standing at sink, R hand on counter, and L hand reached to turn Hot water on and then off                             Pertinent Vitals/Pain Pain Assessment Pain  Assessment: No/denies pain    Home Living Family/patient expects to be discharged to:: Private residence Living Arrangements: Alone Available Help at Discharge: Family;Friend(s);Available PRN/intermittently Type of Home: House Home Access: Stairs to enter Entrance Stairs-Rails: Right Entrance Stairs-Number of Steps: 2   Home Layout: One level Home Equipment: BSC/3in1;Hand held Programmer, systems (2 wheels);Rollator (4 wheels);Shower seat - built in;Grab bars - tub/shower      Prior Function Prior Level of Function : Independent/Modified Independent             Mobility Comments: (P) Independent ADLs Comments: (P) Independent, sister sets up medication box and assists with transportation     Hand Dominance   Dominant Hand: Left    Extremity/Trunk Assessment   Upper Extremity Assessment Upper Extremity Assessment: Defer to OT evaluation    Lower Extremity Assessment Lower Extremity Assessment: Generalized weakness (and dyscoordination)       Communication   Communication: Expressive difficulties (difficult to understand at times)  Cognition Arousal/Alertness: Awake/alert Behavior During Therapy: WFL for tasks assessed/performed (at end of session, pt became restless) Overall Cognitive Status: Impaired/Different from baseline Area of Impairment: Awareness, Safety/judgement                         Safety/Judgement: Decreased awareness of safety, Decreased awareness of deficits Awareness: Intellectual   General Comments: Towards end of session, pt stated he though he'd go home today, as we were discussing his fall risk        General Comments General comments (skin integrity, edema, etc.): Pt's sister Vear Clock present and helpful    Exercises     Assessment/Plan    PT Assessment Patient needs continued PT services  PT Problem List Decreased strength;Decreased activity tolerance;Decreased balance;Decreased mobility;Decreased  coordination;Decreased cognition;Decreased knowledge of use of DME;Decreased safety awareness;Decreased knowledge of precautions       PT Treatment Interventions DME instruction;Gait training;Stair training;Functional mobility training;Therapeutic activities;Therapeutic exercise;Balance training;Neuromuscular re-education;Cognitive remediation;Patient/family education    PT Goals (Current goals can be found in the Care Plan section)  Acute Rehab PT Goals Patient Stated Goal: Staed he wanted a cigarette PT Goal Formulation: With patient/family Time For Goal Achievement: 08/31/22 Potential to Achieve Goals: Good    Frequency Min 3X/week     Co-evaluation               AM-PAC PT "6 Clicks" Mobility  Outcome Measure Help needed turning from your back to your side while in a flat bed without using bedrails?: None Help needed moving from lying on your back to sitting on the side of a flat bed without using bedrails?: A Little Help needed moving to and from a bed to a chair (including a wheelchair)?: A Lot Help needed standing up from a chair using your arms (e.g., wheelchair or bedside chair)?: A Lot Help needed to walk in hospital room?: A Lot Help needed climbing 3-5 steps with a railing? : Total 6 Click Score: 14  End of Session Equipment Utilized During Treatment: Gait belt Activity Tolerance: Patient tolerated treatment well Patient left: in bed;with call bell/phone within reach;with bed alarm set Nurse Communication: Mobility status PT Visit Diagnosis: Unsteadiness on feet (R26.81);Other abnormalities of gait and mobility (R26.89);History of falling (Z91.81)    Time: 1610-9604 PT Time Calculation (min) (ACUTE ONLY): 30 min   Charges:   PT Evaluation $PT Eval Moderate Complexity: 1 Mod PT Treatments $Gait Training: 8-22 mins PT General Charges $$ ACUTE PT VISIT: 1 Visit         Van Clines, PT  Acute Rehabilitation Services Office [phone] Secure Chat  welcomed   Levi Aland 08/17/2022, 5:16 PM

## 2022-08-17 NOTE — Progress Notes (Signed)
PROGRESS NOTE    Gary Mathis  ZOX:[id number] DOB: 1955/02/27 DOA: 08/16/2022 PCP: Patient, No Pcp Per    Chief Complaint  Patient presents with   Weakness    Brief Narrative:  Patient is 67 year old gentleman history of hypertension, hyperlipidemia, low back pain, alcohol abuse presenting with generalized weakness and altered mental status/confusion with some visual hallucinations.  Patient noted to have intermittent confusion ongoing for the past 1 to 2 weeks which had worsened over the past 2 to 3 days prior to admission.  Patient seen in the ED CT head done negative for any acute abnormalities.  CT C-spine negative for any acute abnormalities.  Patient admitted with concerns for possible Warnicke's encephalopathy placed on IV thiamine, IV fluids, Ativan withdrawal protocol.   Assessment & Plan:   Principal Problem:   Acute encephalopathy Active Problems:   Mixed hyperlipidemia   Essential hypertension   Leukocytosis  #1 acute metabolic encephalopathy/visual hallucinations -Patient presented with generalized weakness, ongoing confusion worsened over the past 2 to 3 days and now with some visual hallucinations. -Today patient stating seeing cats and mice plain under his bed. -Patient noted to have been found down at home.  His sister when she went to check up on him on the day of admission and patient also noted to have some recent weight loss with concerns for poor nutrition. -Patient with history of alcohol use concern presentation may be secondary to alcohol withdrawal in the setting of possible Warnicke's encephalopathy. -Patient with a leukocytosis with no signs of obvious infection. -Chest x-ray negative for any acute infiltrates. -Urinalysis nitrite negative, leukocytes negative. -TSH within normal limits, vitamin B12 of 475, ammonia level on admission was 35, -Thiamine level pending. -Check RPR. -Patient afebrile. -Continue high-dose thiamine 500 mg 3 times daily x 5  days, then 250 mg daily x 5 days, then 100 mg daily. -Continued Ativan withdrawal protocol. -Add Librium detox protocol. -MRI of brain. -IV fluids, supportive care.  2.  Alcohol abuse/?  Alcohol withdrawal -Patient states drinks about a 40 ounce of beer daily. -Last alcohol use was 3 days prior to admission. -Patient presented with confusion, generalized weakness, concern for possible alcohol withdrawal. -Thiamine level pending. -Continue high-dose IV thiamine. -Continued Ativan withdrawal protocol. -Placedon the Librium detox protocol. -Continue folic acid, multivitamin. -IV fluids.  3.  Weakness/falls -Head CT negative for any acute abnormalities. -MRI of brain -See problem #1 and 2. -PT/OT.  4.  Hyperlipidemia -Hold statin as patient presented with generalized weakness and fatigue. -Would likely resume statin on discharge.  5.  Hypertension -Continue home regimen Norvasc. -Hold ARB.  6.  Leukocytosis -Likely reactive leukocytosis. -Patient afebrile. -Chest x-ray negative for any acute infiltrate. -Urinalysis not consistent with UTI. -Leukocytosis trending down.    DVT prophylaxis: Lovenox Code Status: Full Family Communication: Updated patient and sister at bedside. Disposition: TBD  Status is: Observation The patient will require care spanning > 2 midnights and should be moved to inpatient because: Severity of illness   Consultants:  None  Procedures: CT head CT C-spine 08/16/2022 Chest x-ray 08/16/2022 MRI brain 08/17/2022  Antimicrobials:  Anti-infectives (From admission, onward)    None         Subjective: Patient having visual hallucinations.  States he is seeing cats and mice playing under his bed.  Denies any auditory hallucinations.  Patient noted to have mittens on.  Patient alert and oriented to self place and time.  Denies any chest pain no shortness of breath.  Sister at  bedside.  Patient states has not had an alcoholic drink in over the past  3 to 4 days.  Objective: Vitals:   08/16/22 1600 08/16/22 1731 08/16/22 1938 08/17/22 0437  BP: 130/78 (!) 143/70 130/64 (!) 131/57  Pulse: 93 90 87 66  Resp:  18 17 16   Temp:  100 F (37.8 C) 98.3 F (36.8 C) 97.6 F (36.4 C)  TempSrc:  Oral Oral Oral  SpO2:  97% 100%   Weight:  60.5 kg    Height:  5\' 11"  (1.803 m)      Intake/Output Summary (Last 24 hours) at 08/17/2022 1159 Last data filed at 08/17/2022 0900 Sofranko per 24 hour  Intake 101.79 ml  Output 1480 ml  Net -1378.21 ml   Filed Weights   08/16/22 1150 08/16/22 1731  Weight: 49.9 kg 60.5 kg    Examination:  General exam: Appears calm and comfortable. Dry mucous membranes. Respiratory system: Clear to auscultation. Respiratory effort normal. Cardiovascular system: S1 & S2 heard, RRR. No JVD, murmurs, rubs, gallops or clicks. No pedal edema. Gastrointestinal system: Abdomen is nondistended, soft and nontender. No organomegaly or masses felt. Normal bowel sounds heard. Central nervous system: Alert and oriented. No focal neurological deficits. Extremities: Symmetric 5 x 5 power. Skin: No rashes, lesions or ulcers Psychiatry: Judgement and insight appear normal. Mood & affect appropriate.     Data Reviewed: I have personally reviewed following labs and imaging studies  CBC: Recent Labs  Lab 08/16/22 1144 08/17/22 0038  WBC 15.9* 10.2  HGB 14.4 12.8*  HCT 42.0 38.1*  MCV 93.1 94.1  PLT 312 268    Basic Metabolic Panel: Recent Labs  Lab 08/16/22 1144 08/16/22 1600 08/17/22 0038  NA 131*  --  134*  K 4.4  --  3.5  CL 97*  --  103  CO2 20*  --  25  GLUCOSE 100*  --  96  BUN 24*  --  16  CREATININE 1.12  --  1.05  CALCIUM 9.4  --  8.7*  MG  --  2.2  --   PHOS  --  3.6  --     GFR: Estimated Creatinine Clearance: 59.2 mL/min (by C-G formula based on SCr of 1.05 mg/dL).  Liver Function Tests: Recent Labs  Lab 08/16/22 1600 08/17/22 0038  AST 47* 50*  ALT 35 29  ALKPHOS 63 52  BILITOT 0.9  0.8  PROT 7.1 5.8*  ALBUMIN 4.3 3.4*    CBG: Recent Labs  Lab 08/16/22 1149  GLUCAP 99     No results found for this or any previous visit (from the past 240 hour(s)).       Radiology Studies: DG CHEST PORT 1 VIEW  Result Date: 08/16/2022 CLINICAL DATA:  Leukocytosis. EXAM: PORTABLE CHEST 1 VIEW COMPARISON:  CT 04/09/2020 FINDINGS: The heart is upper normal in size. Mediastinal contours are normal. There is a small right pleural effusion. Bandlike opacity at the right lung base favored to be atelectasis. IMPRESSION: Small right pleural effusion. Bandlike opacity at the right lung base favored to be atelectasis. Electronically Signed   By: Narda Rutherford M.D.   On: 08/16/2022 17:08   DG Knee Complete 4 Views Left  Result Date: 08/16/2022 CLINICAL DATA:  Left knee pain. EXAM: LEFT KNEE - COMPLETE 4+ VIEW COMPARISON:  None Available. FINDINGS: No evidence of fracture, dislocation, or joint effusion. No evidence of arthropathy or other focal bone abnormality. Arterial vascular calcifications. Soft tissues are unremarkable. IMPRESSION: 1. Peripheral  vascular disease. 2. Otherwise unremarkable radiographic appearance of the left knee. Electronically Signed   By: Narda Rutherford M.D.   On: 08/16/2022 16:12   CT Head Wo Contrast  Result Date: 08/16/2022 CLINICAL DATA:  Head trauma EXAM: CT HEAD WITHOUT CONTRAST CT CERVICAL SPINE WITHOUT CONTRAST TECHNIQUE: Multidetector CT imaging of the head and cervical spine was performed following the standard protocol without intravenous contrast. Multiplanar CT image reconstructions of the cervical spine were also generated. RADIATION DOSE REDUCTION: This exam was performed according to the departmental dose-optimization program which includes automated exposure control, adjustment of the mA and/or kV according to patient size and/or use of iterative reconstruction technique. COMPARISON:  04/09/2020 FINDINGS: CT HEAD FINDINGS Brain: No evidence of acute  infarction, hemorrhage, hydrocephalus, extra-axial collection or mass lesion/mass effect. Periventricular and deep white matter hypodensity. Nonacute lacunar infarctions of the bilateral basal ganglia. Vascular: No hyperdense vessel or unexpected calcification. Skull: Normal. Negative for fracture or focal lesion. Sinuses/Orbits: No acute finding. Other: None. CT CERVICAL SPINE FINDINGS Alignment: Degenerative straightening of the normal cervical lordosis. Skull base and vertebrae: No acute fracture. No primary bone lesion or focal pathologic process. Soft tissues and spinal canal: No prevertebral fluid or swelling. No visible canal hematoma. 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 Upper chest: Negative. Other: None. IMPRESSION: 1. No acute intracranial pathology. 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 2. No fracture or static subluxation of the cervical spine. 3. 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 Electronically Signed   By: Jearld Lesch M.D.   On: 08/16/2022 14:49   CT Cervical Spine Wo Contrast  Result Date: 08/16/2022 CLINICAL DATA:  Head trauma EXAM: CT HEAD WITHOUT CONTRAST CT CERVICAL SPINE WITHOUT CONTRAST TECHNIQUE: Multidetector CT imaging of the head and cervical spine was performed following the standard protocol without intravenous contrast. Multiplanar CT image reconstructions of the cervical spine were also generated. RADIATION DOSE REDUCTION: This exam was performed according to the departmental dose-optimization program which includes automated exposure control, adjustment of the mA and/or kV according to patient size and/or use of iterative reconstruction technique. COMPARISON:  04/09/2020 FINDINGS: CT HEAD FINDINGS Brain: No evidence of acute infarction, hemorrhage, hydrocephalus, extra-axial collection or mass lesion/mass effect. Periventricular  and deep white matter hypodensity. Nonacute lacunar infarctions of the bilateral basal ganglia. Vascular: No hyperdense vessel or unexpected calcification. Skull: Normal. Negative for fracture or focal lesion. Sinuses/Orbits: No acute finding. Other: None. CT CERVICAL SPINE FINDINGS Alignment: Degenerative straightening of the normal cervical lordosis. Skull base and vertebrae: No acute fracture. No primary bone lesion or focal pathologic process. Soft tissues and spinal canal: No prevertebral fluid or swelling. No visible canal hematoma. 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 Upper chest: Negative. Other: None. IMPRESSION: 1. No acute intracranial pathology. 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 2. No fracture or static subluxation of the cervical spine. 3. 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 Electronically Signed   By: Jearld Lesch M.D.   On: 08/16/2022 14:49        Scheduled Meds:  amLODipine  10 mg Oral Daily   enoxaparin (LOVENOX) injection  40 mg Subcutaneous Q24H   folic acid  1 mg Oral Daily   multivitamin with minerals  1 tablet Oral Daily   nicotine  14 mg Transdermal Q0600   rosuvastatin  10 mg Oral Daily  sodium chloride flush  3 mL Intravenous Q12H   Continuous Infusions:  sodium chloride 10 mL/hr at 08/16/22 2220   sodium chloride 100 mL/hr at 08/17/22 0856   thiamine (VITAMIN B1) injection 500 mg (08/17/22 0940)     LOS: 0 days    Time spent: 40 minutes    Ramiro Harvest, MD [facility]   To contact the attending provider between 7A-7P or the covering provider during after hours 7P-7A, please log into the web site [email or web address] and access using universal [place] password for that web site. If you do not have the password, please call the hospital operator.  08/17/2022, 11:59 AM

## 2022-08-17 NOTE — Evaluation (Signed)
Occupational Therapy Evaluation Patient Details Name: Gary Mathis MRN: [id number] DOB: Jan 21, 1956 Today's Date: 08/17/2022   History of Present Illness Gary Mathis is a 67 y.o. male  presenting with weakness and altered mental status; Sister found him down; with medical history significant of hypertension, hyperlipidemia, low back pain, alcohol use   Clinical Impression   At baseline, pt completes ADLs and functional mobility Independently. At baseline, pt receives assistance from sister for transportation and setting up a medication planner. Pt now presents with decreased cognition, generalized B UE weakness, decreased L UE proprioception, decreased B UE fine and Nickles motor coordination, decreased activity tolerance, decreased sitting and standing balance during functional tasks, and decreased safety and independence with ADLs and functional transfers/mobility. Pt currently demonstrates ability to complete UB ADLs with Set up to Mod assist, LB ADLs with Max assist, and functional transfers with a RW with Mod to Max assist. Pt currently requires cues for sequencing and safety throughout functional tasks. Pt will benefit from acute skilled OT services to address deficits outlined below, decrease caregiver burden, and increase safety and independence with ADLs, functional transfers, and functional mobility. Post acute discharge, pt will benefit from intensive inpatient skilled OT services < 3 hours per day to maximize rehab potential.      Recommendations for follow up therapy are one component of a multi-disciplinary discharge planning process, led by the attending physician.  Recommendations may be updated based on patient status, additional functional criteria and insurance authorization.   Assistance Recommended at Discharge Frequent or constant Supervision/Assistance  Patient can return home with the following A lot of help with walking and/or transfers;A lot of help with  bathing/dressing/bathroom;Assistance with cooking/housework;Assistance with feeding;Direct supervision/assist for medications management;Direct supervision/assist for financial management;Assist for transportation;Help with stairs or ramp for entrance (Set up for self feeding)    Functional Status Assessment  Patient has had a recent decline in their functional status and demonstrates the ability to make significant improvements in function in a reasonable and predictable amount of time.  Equipment Recommendations  Other (comment) (Defer to next level of care)    Recommendations for Other Services Speech consult (for cognition)     Precautions / Restrictions Precautions Precautions: Fall Restrictions Weight Bearing Restrictions: No      Mobility Bed Mobility Overal bed mobility: Needs Assistance Bed Mobility: Supine to Sit, Sit to Supine     Supine to sit: Min guard Sit to supine: Min assist   General bed mobility comments: Pt requires cues for sequencing and technique throughout.    Transfers Overall transfer level: Needs assistance Equipment used: Rolling walker (2 wheels) Transfers: Sit to/from Stand Sit to Stand: Mod assist           General transfer comment: Heavy mod assist to help with anterior weight shifting and to power up; stands with very wide BOS.      Balance Overall balance assessment: Needs assistance Sitting-balance support: Single extremity supported, No upper extremity supported, Feet supported Sitting balance-Leahy Scale: Fair     Standing balance support: Bilateral upper extremity supported, During functional activity, Reliant on assistive device for balance Standing balance-Leahy Scale: Zero Standing balance comment: Pt with heavy posterior lean requiring Mod assist to maintain standing.                           ADL either performed or assessed with clinical judgement   ADL Overall ADL's : Needs  assistance/impaired Eating/Feeding: Set up;Sitting (  with back supported)   Grooming: Set up;Cueing for sequencing;Sitting (with back supported)   Upper Body Bathing: Moderate assistance;Cueing for safety;Cueing for sequencing;Bed level   Lower Body Bathing: Maximal assistance;Cueing for sequencing;Cueing for safety;Bed level   Upper Body Dressing : Moderate assistance;Cueing for safety;Cueing for sequencing;Sitting (with back supported)   Lower Body Dressing: Moderate assistance;Cueing for safety;Cueing for sequencing;Sitting/lateral leans   Toilet Transfer: Maximal assistance;Cueing for safety;Cueing for sequencing;BSC/3in1;Rolling walker (2 wheels) (step-pivot)   Toileting- Clothing Manipulation and Hygiene: Maximal assistance;Sitting/lateral lean;Sit to/from stand;Cueing for safety;Cueing for sequencing         General ADL Comments: Functional mobility deferred this session for pt/therapist safety as pt presented with persistant posterior lean in standing and side-stepping Left and Right.     Vision Baseline Vision/History: 1 Wears glasses (reading glasses; had progressives but lost them, has appointment with optometrist scheduled in August to get new glasses) Ability to See in Adequate Light: 1 Impaired Patient Visual Report: No change from baseline Additional Comments: OT to further assess vision in future skilled OT session.     Perception     Praxis Praxis Praxis tested?: Deficits Deficits: Ideomotor;Motor Impersistence;Organization    Pertinent Vitals/Pain Pain Assessment Pain Assessment: No/denies pain     Hand Dominance Left   Extremity/Trunk Assessment Upper Extremity Assessment Upper Extremity Assessment: Generalized weakness;RUE deficits/detail;LUE deficits/detail RUE Coordination: decreased fine motor;decreased Strothers motor LUE Sensation: decreased proprioception LUE Coordination: decreased fine motor;decreased Stamps motor   Lower Extremity  Assessment Lower Extremity Assessment: Generalized weakness (and dyscoordination)       Communication Communication Communication: Receptive difficulties;Expressive difficulties (Requires increased time for processing. Sometimes difficult to understand speech.)   Cognition Arousal/Alertness: Awake/alert Behavior During Therapy: WFL for tasks assessed/performed Overall Cognitive Status: Impaired/Different from baseline Area of Impairment: Attention, Following commands, Safety/judgement, Awareness, Problem solving                   Current Attention Level: Sustained   Following Commands: Follows one step commands inconsistently, Follows one step commands with increased time Safety/Judgement: Decreased awareness of safety, Decreased awareness of deficits Awareness: Intellectual Problem Solving: Slow processing, Difficulty sequencing, Requires verbal cues, Requires tactile cues       General Comments  VSS on RA throughout session. Pt's sister, who is pt's power of attorney, present throughout session.    Exercises     Shoulder Instructions      Home Living Family/patient expects to be discharged to:: Private residence Living Arrangements: Alone Available Help at Discharge: Family;Friend(s);Available PRN/intermittently (Sister, friend) Type of Home: House Home Access: Stairs to enter Secretary/administrator of Steps: 2 Entrance Stairs-Rails: Right Home Layout: One level     Bathroom Shower/Tub: Tub/shower unit;Walk-in shower (usually uses the shower)   Bathroom Toilet: Standard     Home Equipment: BSC/3in1;Hand held Programmer, systems (2 wheels);Rollator (4 wheels);Shower seat - built in;Grab bars - tub/shower          Prior Functioning/Environment Prior Level of Function : Independent/Modified Independent             Mobility Comments: Independent ADLs Comments: Independent with ADLs, sister sets up medication box and assists pt with  transportation        OT Problem List: Decreased strength;Decreased activity tolerance;Impaired balance (sitting and/or standing);Decreased coordination;Decreased cognition;Decreased safety awareness      OT Treatment/Interventions: Self-care/ADL training;Therapeutic exercise;DME and/or AE instruction;Therapeutic activities;Patient/family education;Cognitive remediation/compensation;Balance training    OT Goals(Current goals can be found in the care plan section) Acute Rehab OT  Goals Patient Stated Goal: Pt states he would like to be able to use the bathroom independently. OT Goal Formulation: With patient/family Time For Goal Achievement: 08/31/22 Potential to Achieve Goals: Good ADL Goals Pt Will Perform Grooming: standing;with supervision Pt Will Perform Lower Body Dressing: sit to/from stand;with supervision Pt Will Transfer to Toilet: with supervision;ambulating;regular height toilet;grab bars (with least restrictive AD) Pt Will Perform Toileting - Clothing Manipulation and hygiene: with supervision;sit to/from stand Additional ADL Goal #1: Patient will demonstrate ability to appropriately sequence and complete a 3-step therapeutic of functional task with Mod I while demonstrating Alternating attetion. Additional ADL Goal #2: Patient will demonstrate ability to follow 1 and 2-step commands in 8/10 trials during vision screen.  OT Frequency: Min 2X/week    Co-evaluation              AM-PAC OT "6 Clicks" Daily Activity     Outcome Measure Help from another person eating meals?: A Little Help from another person taking care of personal grooming?: A Little (in sitting) Help from another person toileting, which includes using toliet, bedpan, or urinal?: A Lot Help from another person bathing (including washing, rinsing, drying)?: A Lot Help from another person to put on and taking off regular upper body clothing?: A Lot Help from another person to put on and taking off regular  lower body clothing?: A Lot 6 Click Score: 14   End of Session Equipment Utilized During Treatment: Gait belt;Rolling walker (2 wheels) Nurse Communication: Mobility status  Activity Tolerance: Patient tolerated treatment well Patient left: in bed;with call bell/phone within reach;with bed alarm set;with family/visitor present  OT Visit Diagnosis: Unsteadiness on feet (R26.81);Other abnormalities of gait and mobility (R26.89);History of falling (Z91.81);Muscle weakness (generalized) (M62.81);Ataxia, unspecified (R27.0);Other symptoms and signs involving cognitive function                Time: 1511-1539 OT Time Calculation (min): 28 min Charges:  OT General Charges $OT Visit: 1 Visit OT Evaluation $OT Eval Moderate Complexity: 1 Mod  [address] Grant DriveMolson Coors Brewing., OTR/L, MA Acute Rehab [phone]   Lendon Colonel 08/17/2022, 6:08 PM

## 2022-08-18 DIAGNOSIS — G9340 Encephalopathy, unspecified: Secondary | ICD-10-CM | POA: Diagnosis not present

## 2022-08-18 DIAGNOSIS — F1010 Alcohol abuse, uncomplicated: Secondary | ICD-10-CM | POA: Diagnosis not present

## 2022-08-18 DIAGNOSIS — D72829 Elevated white blood cell count, unspecified: Secondary | ICD-10-CM | POA: Diagnosis not present

## 2022-08-18 DIAGNOSIS — R531 Weakness: Secondary | ICD-10-CM | POA: Diagnosis not present

## 2022-08-18 LAB — CBC
HCT: 39.9 % (ref 39.0–52.0)
Hemoglobin: 13.4 g/dL (ref 13.0–17.0)
MCH: 32.2 pg (ref 26.0–34.0)
MCHC: 33.6 g/dL (ref 30.0–36.0)
MCV: 95.9 fL (ref 80.0–100.0)
Platelets: 266 10*3/uL (ref 150–400)
RBC: 4.16 MIL/uL — ABNORMAL LOW (ref 4.22–5.81)
RDW: 12.9 % (ref 11.5–15.5)
WBC: 8.9 10*3/uL (ref 4.0–10.5)
nRBC: 0 % (ref 0.0–0.2)

## 2022-08-18 LAB — RENAL FUNCTION PANEL
Albumin: 3.4 g/dL — ABNORMAL LOW (ref 3.5–5.0)
Anion gap: 8 (ref 5–15)
BUN: 9 mg/dL (ref 8–23)
CO2: 27 mmol/L (ref 22–32)
Calcium: 8.6 mg/dL — ABNORMAL LOW (ref 8.9–10.3)
Chloride: 104 mmol/L (ref 98–111)
Creatinine, Ser: 0.83 mg/dL (ref 0.61–1.24)
GFR, Estimated: >60 mL/min (ref >60)
Glucose, Bld: 95 mg/dL (ref 70–99)
Phosphorus: 2.7 mg/dL (ref 2.5–4.6)
Potassium: 3.2 mmol/L — ABNORMAL LOW (ref 3.5–5.1)
Sodium: 139 mmol/L (ref 135–145)

## 2022-08-18 LAB — MAGNESIUM: Magnesium: 1.9 mg/dL (ref 1.7–2.4)

## 2022-08-18 MED ORDER — POTASSIUM CHLORIDE CRYS ER 10 MEQ PO TBCR
40.0000 meq | EXTENDED_RELEASE_TABLET | ORAL | Status: AC
  Administered 2022-08-18 (×2): 40 meq via ORAL
  Filled 2022-08-18 (×2): qty 4

## 2022-08-18 MED ORDER — POTASSIUM CHLORIDE CRYS ER 20 MEQ PO TBCR: 40.0000 meq | EXTENDED_RELEASE_TABLET | Freq: Once | ORAL | Status: DC

## 2022-08-18 NOTE — Plan of Care (Signed)

## 2022-08-18 NOTE — Progress Notes (Addendum)
PROGRESS NOTE    Gary Mathis  VHQ:[id number] DOB: January 30, 1956 DOA: 08/16/2022 PCP: Gary Mathis, No Pcp Per    Chief Complaint  Gary Mathis presents with   Weakness    Brief Narrative:  Gary Mathis is 67 year old gentleman history of hypertension, hyperlipidemia, low back pain, alcohol abuse presenting with generalized weakness and altered mental status/confusion with some visual hallucinations.  Gary Mathis noted to have intermittent confusion ongoing for the past 1 to 2 weeks which had worsened over the past 2 to 3 days prior to admission.  Gary Mathis seen in the ED CT head done negative for any acute abnormalities.  CT C-spine negative for any acute abnormalities.  Gary Mathis admitted with concerns for possible Warnicke's encephalopathy placed on IV thiamine, IV fluids, Ativan withdrawal protocol.   Assessment & Plan:   Principal Problem:   Acute encephalopathy Active Problems:   Mixed hyperlipidemia   Essential hypertension   Leukocytosis   Generalized weakness   Alcohol abuse   Hallucination, visual  #1 acute metabolic encephalopathy/visual hallucinations -Gary Mathis presented with generalized weakness, ongoing confusion worsened over the past 2 to 3 days and now with some visual hallucinations. -On 08/17/2022 Gary Mathis noted to have some visual hallucinations stating that he was seen cats and mice playing under his bed.   -Gary Mathis denies any further visual hallucinations today, 08/18/2022. -Gary Mathis noted to have been found down at home.  His sister when she went to check up on him on the day of admission and Gary Mathis also noted to have some recent weight loss with concerns for poor nutrition. -Gary Mathis with history of alcohol use concern presentation may be secondary to alcohol withdrawal in the setting of possible Wernicke's encephalopathy. -Gary Mathis with a leukocytosis with no signs of obvious infection. -Chest x-ray negative for any acute infiltrates. -Urinalysis nitrite negative, leukocytes  negative. -TSH within normal limits, vitamin B12 of 475, ammonia level on admission was 35, -Thiamine level pending. -RPR nonreactive.   -Gary Mathis afebrile. -MRI brain negative for any acute abnormalities. -Continue high-dose thiamine 500 mg 3 times daily x 5 days, then 250 mg daily x 5 days, then 100 mg daily. -Continued Ativan withdrawal protocol. -Continue Librium detox protocol. -IV fluids, supportive care.  2.  Alcohol abuse/?  Alcohol withdrawal -Gary Mathis states drinks about a 40 ounce of beer daily. -Last alcohol use was 3 days prior to admission. -Gary Mathis presented with confusion, generalized weakness, concern for possible alcohol withdrawal. -Thiamine level pending. -Continue high-dose IV thiamine. -Continued Ativan withdrawal protocol. -Continue Librium detox protocol.   -Continue folic acid, multivitamin.   -IV fluids.    3.  Weakness/falls -Head CT negative for any acute abnormalities. -MRI of brain -See problem #1 and 2. -PT/OT has assessed Gary Mathis and recommended SNF placement.  4.  Hyperlipidemia -Continue to hold statin as Gary Mathis presented with generalized weakness and fatigue. -Would likely resume statin on discharge.  5.  Hypertension -Continue home regimen Norvasc.   -ARB on hold could likely resume in the next 24 hours.  6.  Leukocytosis -Likely reactive leukocytosis. -Gary Mathis afebrile. -Chest x-ray negative for any acute infiltrate. -Urinalysis not consistent with UTI. -Leukocytosis trending down. -Follow-up.  7.  Hypokalemia -Replete.    DVT prophylaxis: Lovenox Code Status: Full Family Communication: Updated Gary Mathis and sister at bedside. Disposition: TBD  Status is: Observation The Gary Mathis will require care spanning > 2 midnights and should be moved to inpatient because: Severity of illness   Consultants:  None  Procedures: CT head CT C-spine 08/16/2022 Chest x-ray 08/16/2022 MRI brain 08/17/2022  Antimicrobials:  Anti-infectives  (From admission, onward)    None         Subjective: Gary Mathis laying in bed sleeping but arousable.  States visual hallucinations have improved.  Denies any chest pain or shortness of breath.  Overall feeling better.  Sister at bedside.  .  Objective: Vitals:   08/18/22 0441 08/18/22 0649 08/18/22 0757 08/18/22 1503  BP: (!) 157/74 (!) 154/83 (!) 148/89 (!) 151/86  Pulse: 73 73 77 77  Resp:  16 20 18   Temp:  97.7 F (36.5 C) 97.8 F (36.6 C) 98.2 F (36.8 C)  TempSrc:  Axillary Oral Oral  SpO2: 98% 98% 95% 97%  Weight:      Height:        Intake/Output Summary (Last 24 hours) at 08/18/2022 1614 Last data filed at 08/18/2022 1500 [place] per 24 hour  Intake 2391.95 ml  Output 2400 ml  Net -8.05 ml   Filed Weights   08/16/22 1150 08/16/22 1731  Weight: 49.9 kg 60.5 kg    Examination:  General exam: NAD.  Dry mucous membranes.  Respiratory system: Lungs clear to auscultation bilaterally anterior lung fields.  No wheezes, no crackles, no rhonchi.  Fair air movement.  Speaking in full sentences.   Cardiovascular system: Regular rate and rhythm no murmurs rubs or gallops.  No JVD.  No lower extremity edema.   Gastrointestinal system: Soft, nontender, nondistended, positive bowel sounds.  No rebound.  No guarding.   Central nervous system: Alert and oriented. No focal neurological deficits. Extremities: Symmetric 5 x 5 power. Skin: No rashes, lesions or ulcers Psychiatry: Judgement and insight appear normal. Mood & affect appropriate.     Data Reviewed: I have personally reviewed following labs and imaging studies  CBC: Recent Labs  Lab 08/16/22 1144 08/17/22 0038 08/18/22 0851  WBC 15.9* 10.2 8.9  HGB 14.4 12.8* 13.4  HCT 42.0 38.1* 39.9  MCV 93.1 94.1 95.9  PLT 312 268 266    Basic Metabolic Panel: Recent Labs  Lab 08/16/22 1144 08/16/22 1600 08/17/22 0038 08/18/22 0851  NA 131*  --  134* 139  K 4.4  --  3.5 3.2*  CL 97*  --  103 104  CO2 20*  --  25  27  GLUCOSE 100*  --  96 95  BUN 24*  --  16 9  CREATININE 1.12  --  1.05 0.83  CALCIUM 9.4  --  8.7* 8.6*  MG  --  2.2  --  1.9  PHOS  --  3.6  --  2.7    GFR: Estimated Creatinine Clearance: 74.9 mL/min (by C-G formula based on SCr of 0.83 mg/dL).  Liver Function Tests: Recent Labs  Lab 08/16/22 1600 08/17/22 0038 08/18/22 0851  AST 47* 50*  --   ALT 35 29  --   ALKPHOS 63 52  --   BILITOT 0.9 0.8  --   PROT 7.1 5.8*  --   ALBUMIN 4.3 3.4* 3.4*    CBG: Recent Labs  Lab 08/16/22 1149  GLUCAP 99     No results found for this or any previous visit (from the past 240 hour(s)).       Radiology Studies: MR BRAIN WO CONTRAST  Result Date: 08/17/2022 CLINICAL DATA:  Altered mental status EXAM: MRI HEAD WITHOUT CONTRAST TECHNIQUE: Multiplanar, multiecho pulse sequences of the brain and surrounding structures were obtained without intravenous contrast. COMPARISON:  CT head 1 day prior FINDINGS: Image quality is degraded by motion  artifact. Brain: There is no acute intracranial hemorrhage, extra-axial fluid collection, or acute infarct There is background parenchymal volume loss with prominence of the ventricular system and extra-axial CSF spaces. There is patchy and confluent FLAIR signal abnormality in the supratentorial white matter likely reflecting sequela of moderate chronic small-vessel ischemic change, advanced for age. 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 The pituitary and suprasellar region are normal. There is no mass lesion. There is no mass effect or midline shift. Vascular: Normal flow voids. Skull and upper cervical spine: Normal marrow signal. Sinuses/Orbits: There is mild mucosal thickening in the paranasal sinuses. The globes and orbits are unremarkable. Other: The mastoid air cells and middle ear cavities are clear. IMPRESSION: Motion degraded study. No definite evidence of acute intracranial pathology. Electronically Signed    By: Lesia Hausen M.D.   On: 08/17/2022 17:25   DG CHEST PORT 1 VIEW  Result Date: 08/16/2022 CLINICAL DATA:  Leukocytosis. EXAM: PORTABLE CHEST 1 VIEW COMPARISON:  CT 04/09/2020 FINDINGS: The heart is upper normal in size. Mediastinal contours are normal. There is a small right pleural effusion. Bandlike opacity at the right lung base favored to be atelectasis. IMPRESSION: Small right pleural effusion. Bandlike opacity at the right lung base favored to be atelectasis. Electronically Signed   By: Narda Rutherford M.D.   On: 08/16/2022 17:08        Scheduled Meds:  amLODipine  10 mg Oral Daily   aspirin EC  81 mg Oral Daily   chlordiazePOXIDE  25 mg Oral TID   Followed by   Melene Muller ON 08/19/2022] chlordiazePOXIDE  25 mg Oral BH-qamhs   Followed by   Melene Muller ON 08/20/2022] chlordiazePOXIDE  25 mg Oral Daily   enoxaparin (LOVENOX) injection  40 mg Subcutaneous Q24H   feeding supplement  237 mL Oral BID BM   folic acid  1 mg Oral Daily   multivitamin with minerals  1 tablet Oral Daily   nicotine  14 mg Transdermal Q0600   pantoprazole  40 mg Oral Q0600   sodium chloride flush  3 mL Intravenous Q12H   Continuous Infusions:  sodium chloride 10 mL/hr at 08/16/22 2220   sodium chloride 100 mL/hr at 08/18/22 0645   thiamine (VITAMIN B1) injection 500 mg (08/18/22 1525)     LOS: 1 day    Time spent: 40 minutes    Ramiro Harvest, MD [facility]   To contact the attending provider between 7A-7P or the covering provider during after hours 7P-7A, please log into the web site [email or web address] and access using universal [place] password for that web site. If you do not have the password, please call the hospital operator.  08/18/2022, 4:14 PM

## 2022-08-18 NOTE — TOC Initial Note (Signed)
Transition of Care [facility]) - Initial/Assessment Note    Patient Details  Name: Gary Mathis MRN: [id number] Date of Birth: 29-Jul-1955  Transition of Care [facility]) CM/SW Contact:    Ralene Bathe, LCSW Phone Number: 08/18/2022, 1:37 PM  Clinical Narrative:                 LCSW received consult for SNF and was informed that the patient's sister, Triva, requesting to speak with LCSW.  The sister reports that the patient is from home alone, but she lives close and checks on patient frequently.  The sister informed LCSW that she is aware of the SNF recommendation and requested that the patient go to [facility].  The sister reports that she has already call "[facility]" in admissions and asked that the facility be on the lookout for the referral.  LCSW explained that the facility will have to review the referral, ensure that they can meet the patient's needs, ensure that they accept the patient's insurance, and should the patient's mentation improve he would have to agree to SNF placement.  LCSW will send our referral when patient is closer to being medically ready.  TOC following.  Expected Discharge Plan: Skilled Nursing Facility Barriers to Discharge: Continued Medical Work up, English as a second language teacher, SNF Pending bed offer   Patient Goals and CMS Choice   CMS Medicare.gov Compare Post Acute Care list provided to:: Patient Represenative (must comment) Choice offered to / list presented to : Sibling      Expected Discharge Plan and Services In-house Referral: Clinical Social Work     Living arrangements for the past 2 months: Single Family Home                                      Prior Living Arrangements/Services Living arrangements for the past 2 months: Single Family Home Lives with:: Self Patient language and need for interpreter reviewed:: Yes        Need for Family Participation in Patient Care: Yes (Comment) Care giver support system in place?: Yes (comment)    Criminal Activity/Legal Involvement Pertinent to Current Situation/Hospitalization: No - Comment as needed  Activities of Daily Living Home Assistive Devices/Equipment: None ADL Screening (condition at time of admission) Patient's cognitive ability adequate to safely complete daily activities?: No Is the patient deaf or have difficulty hearing?: No Does the patient have difficulty seeing, even when wearing glasses/contacts?: No Does the patient have difficulty concentrating, remembering, or making decisions?: Yes Patient able to express need for assistance with ADLs?: Yes Does the patient have difficulty dressing or bathing?: Yes Independently performs ADLs?: No Does the patient have difficulty walking or climbing stairs?: Yes Weakness of Legs: Both Weakness of Arms/Hands: None  Permission Sought/Granted                  Emotional Assessment       Orientation: : Oriented to Place, Oriented to Self Alcohol / Substance Use: Alcohol Use Psych Involvement: No (comment)  Admission diagnosis:  Disorientation [R41.0] Acute encephalopathy [G93.40] Generalized weakness [R53.1] Patient Active Problem List   Diagnosis Date Noted   Generalized weakness 08/17/2022   Alcohol abuse 08/17/2022   Hallucination, visual 08/17/2022   Acute encephalopathy 08/16/2022   Leukocytosis 08/16/2022   History of alcohol dependence (HCC) 04/23/2022   Pneumothorax 04/09/2020   Prediabetes 12/30/2018   Mixed hyperlipidemia 12/30/2018   Essential hypertension 12/30/2018   Chronic  bilateral low back pain with right-sided sciatica 12/17/2018   PCP:  Patient, No Pcp Per Pharmacy:   [facility] [address] [place] - [address] [place] [address] Phone: [phone] Fax: [phone]  [id number] [place], [place] - [address] AT [facility] [address] [facility] [address] [place] [address] Phone: [phone] Fax: [phone]  [facility]  [address] [place], [place] - [address] [place] [address] Phone: [phone] Fax: [phone]     Social Determinants of Health (SDOH) Social History: SDOH Screenings   Food Insecurity: Food Insecurity Present (08/16/2022)  Housing: Low Risk  (08/16/2022)  Transportation Needs: No Transportation Needs (08/16/2022)  Utilities: Not At Risk (08/16/2022)  Tobacco Use: High Risk (04/11/2020)   SDOH Interventions:     Readmission Risk Interventions     No data to display

## 2022-08-19 DIAGNOSIS — R531 Weakness: Secondary | ICD-10-CM | POA: Diagnosis not present

## 2022-08-19 DIAGNOSIS — E43 Unspecified severe protein-calorie malnutrition: Secondary | ICD-10-CM | POA: Insufficient documentation

## 2022-08-19 DIAGNOSIS — G9340 Encephalopathy, unspecified: Secondary | ICD-10-CM | POA: Diagnosis not present

## 2022-08-19 DIAGNOSIS — D72829 Elevated white blood cell count, unspecified: Secondary | ICD-10-CM | POA: Diagnosis not present

## 2022-08-19 DIAGNOSIS — F1010 Alcohol abuse, uncomplicated: Secondary | ICD-10-CM | POA: Diagnosis not present

## 2022-08-19 LAB — CBC
HCT: 44.9 % (ref 39.0–52.0)
Hemoglobin: 15.2 g/dL (ref 13.0–17.0)
MCH: 31.7 pg (ref 26.0–34.0)
MCHC: 33.9 g/dL (ref 30.0–36.0)
MCV: 93.5 fL (ref 80.0–100.0)
Platelets: 280 10*3/uL (ref 150–400)
RBC: 4.8 MIL/uL (ref 4.22–5.81)
RDW: 12.6 % (ref 11.5–15.5)
WBC: 8.6 10*3/uL (ref 4.0–10.5)
nRBC: 0 % (ref 0.0–0.2)

## 2022-08-19 LAB — BASIC METABOLIC PANEL
Anion gap: 12 (ref 5–15)
BUN: 9 mg/dL (ref 8–23)
CO2: 23 mmol/L (ref 22–32)
Calcium: 9.3 mg/dL (ref 8.9–10.3)
Chloride: 102 mmol/L (ref 98–111)
Creatinine, Ser: 0.79 mg/dL (ref 0.61–1.24)
GFR, Estimated: >60 mL/min (ref >60)
Glucose, Bld: 89 mg/dL (ref 70–99)
Potassium: 3.9 mmol/L (ref 3.5–5.1)
Sodium: 137 mmol/L (ref 135–145)

## 2022-08-19 LAB — VITAMIN B1: Vitamin B1 (Thiamine): 293.6 nmol/L — ABNORMAL HIGH (ref 66.5–200.0)

## 2022-08-19 LAB — CK: Total CK: 419 U/L — ABNORMAL HIGH (ref 49–397)

## 2022-08-19 MED ORDER — THIAMINE HCL 100 MG/ML IJ SOLN: 250.0000 mg | Freq: Every day | INTRAVENOUS | Status: DC

## 2022-08-19 NOTE — Progress Notes (Signed)
PROGRESS NOTE    Gary Mathis  ZOX:[id number] DOB: July 27, 1955 DOA: 08/16/2022 PCP: Patient, No Pcp Per    Chief Complaint  Patient presents with   Weakness    Brief Narrative:  Patient is 67 year old gentleman history of hypertension, hyperlipidemia, low back pain, alcohol abuse presenting with generalized weakness and altered mental status/confusion with some visual hallucinations.  Patient noted to have intermittent confusion ongoing for the past 1 to 2 weeks which had worsened over the past 2 to 3 days prior to admission.  Patient seen in the ED CT head done negative for any acute abnormalities.  CT C-spine negative for any acute abnormalities.  Patient admitted with concerns for possible Warnicke's encephalopathy placed on IV thiamine, IV fluids, Ativan withdrawal protocol.   Assessment & Plan:   Principal Problem:   Acute encephalopathy Active Problems:   Mixed hyperlipidemia   Essential hypertension   Leukocytosis   Generalized weakness   Alcohol abuse   Hallucination, visual   Protein-calorie malnutrition, severe  #1 acute metabolic encephalopathy/visual hallucinations -Patient presented with generalized weakness, ongoing confusion worsened over the past 2 to 3 days and now with some visual hallucinations. -On 08/17/2022 patient noted to have some visual hallucinations stating that he was seen cats and mice playing under his bed.   -Patient denies any further visual hallucinations on 08/18/2022 however persisted patient with some visual hallucinations today 08/19/2022.. -Patient noted to have been found down at home.  His sister when she went to check up on him on the day of admission and patient also noted to have some recent weight loss with concerns for poor nutrition. -Patient with history of alcohol use concern presentation may be secondary to alcohol withdrawal in the setting of possible Wernicke's encephalopathy. -Concern that patient may also have an underlying dementia.   Sister concern patient may have possible underlying dementia as she also states prior to admission patient had been having some bouts of sundowning. -Patient with a leukocytosis with no signs of obvious infection which has since resolved.. -Chest x-ray negative for any acute infiltrates. -Urinalysis nitrite negative, leukocytes negative. -TSH within normal limits, vitamin B12 of 475, ammonia level on admission was 35, -Thiamine level pending. -RPR nonreactive.   -Patient afebrile. -MRI brain negative for any acute abnormalities. -Continue high-dose thiamine 500 mg 3 times daily x 5 days, then 250 mg daily x 5 days, then 100 mg daily. -Continued Ativan withdrawal protocol. -Continue Librium detox protocol. -If no significant improvement after completion of Librium detox protocol may consider neurology input. -IV fluids, supportive care.  2.  Alcohol abuse/?  Alcohol withdrawal -Patient states drinks about a 40 ounce of beer daily. -Last alcohol use was 3 days prior to admission. -Patient presented with confusion, generalized weakness, concern for possible alcohol withdrawal. -Thiamine level pending. -Continue high-dose IV thiamine. -Continued Ativan withdrawal protocol. -Continue Librium detox protocol.   -Continue folic acid, multivitamin.   -IV fluids.    3.  Weakness/falls -Head CT negative for any acute abnormalities. -MRI of brain negative for any acute abnormalities. -See problem #1 and 2. -PT/OT has assessed patient and recommended SNF placement.  4.  Hyperlipidemia -Continue to hold statin as patient presented with generalized weakness and fatigue. -Would likely resume statin on discharge.  5.  Hypertension -Continue Norvasc.   -Continue to hold ARB.   6.  Leukocytosis -Likely reactive leukocytosis. -Patient afebrile. -Chest x-ray negative for any acute infiltrate. -Urinalysis not consistent with UTI. -Leukocytosis has improved.   -Follow.  7.  Hypokalemia -Repleted.     DVT prophylaxis: Lovenox Code Status: Full Family Communication: Updated patient and sister at bedside. Disposition: TBD  Status is: Observation The patient will require care spanning > 2 midnights and should be moved to inpatient because: Severity of illness   Consultants:  None  Procedures: CT head CT C-spine 08/16/2022 Chest x-ray 08/16/2022 MRI brain 08/17/2022  Antimicrobials:  Anti-infectives (From admission, onward)    None         Subjective: Patient sleeping but arousable.  Somewhat sedated as per sister just received some Ativan.  [place] on.  Patient denies any chest pain or shortness of breath.  No abdominal pain.  Patient still with visual hallucinations noted flowers in the room and people persisted.  Sister concerned as to whether patient may have an underlying dementia as she states prior to admission patient did have some bouts of sundowning.  Patient alert to self and place.    Objective: Vitals:   08/18/22 1503 08/18/22 2058 08/19/22 0915 08/19/22 1636  BP: (!) 151/86 (!) 156/84 (!) 148/85 136/68  Pulse: 77 74 72 68  Resp: 18 16 18 18   Temp: 98.2 F (36.8 C) 98.4 F (36.9 C) 98 F (36.7 C) 98.6 F (37 C)  TempSrc: Oral Oral Oral Axillary  SpO2: 97% 98% 98% 98%  Weight:      Height:        Intake/Output Summary (Last 24 hours) at 08/19/2022 1707 Last data filed at 08/19/2022 1225 Dimitrov per 24 hour  Intake 1042.66 ml  Output 600 ml  Net 442.66 ml    Filed Weights   08/16/22 1150 08/16/22 1731  Weight: 49.9 kg 60.5 kg    Examination:  General exam: NAD.  Dry mucous membranes. Respiratory system: CTAB.  No wheezes, no crackles, no rhonchi.  Fair air movement.  Speaking in full sentences.  Cardiovascular system: RRR no murmurs rubs or gallops.  No JVD.  No lower extremity edema.   Gastrointestinal system: Soft, nontender, nondistended, positive bowel sounds.  No rebound.  No guarding.  Central nervous system: Alert  and oriented to self and place only. No focal neurological deficits. Extremities: Symmetric 5 x 5 power. Skin: No rashes, lesions or ulcers Psychiatry: Judgement and insight appear poor. Mood & affect appropriate.     Data Reviewed: I have personally reviewed following labs and imaging studies  CBC: Recent Labs  Lab 08/16/22 1144 08/17/22 0038 08/18/22 0851 08/19/22 0708  WBC 15.9* 10.2 8.9 8.6  HGB 14.4 12.8* 13.4 15.2  HCT 42.0 38.1* 39.9 44.9  MCV 93.1 94.1 95.9 93.5  PLT 312 268 266 280     Basic Metabolic Panel: Recent Labs  Lab 08/16/22 1144 08/16/22 1600 08/17/22 0038 08/18/22 0851 08/19/22 0708  NA 131*  --  134* 139 137  K 4.4  --  3.5 3.2* 3.9  CL 97*  --  103 104 102  CO2 20*  --  25 27 23   GLUCOSE 100*  --  96 95 89  BUN 24*  --  16 9 9   CREATININE 1.12  --  1.05 0.83 0.79  CALCIUM 9.4  --  8.7* 8.6* 9.3  MG  --  2.2  --  1.9  --   PHOS  --  3.6  --  2.7  --      GFR: Estimated Creatinine Clearance: 77.7 mL/min (by C-G formula based on SCr of 0.79 mg/dL).  Liver Function Tests: Recent Labs  Lab 08/16/22 1600 08/17/22 0038  08/18/22 0851  AST 47* 50*  --   ALT 35 29  --   ALKPHOS 63 52  --   BILITOT 0.9 0.8  --   PROT 7.1 5.8*  --   ALBUMIN 4.3 3.4* 3.4*     CBG: Recent Labs  Lab 08/16/22 1149  GLUCAP 99      No results found for this or any previous visit (from the past 240 hour(s)).       Radiology Studies: No results found.      Scheduled Meds:  amLODipine  10 mg Oral Daily   aspirin EC  81 mg Oral Daily   chlordiazePOXIDE  25 mg Oral BH-qamhs   Followed by   Melene Muller ON 08/20/2022] chlordiazePOXIDE  25 mg Oral Daily   enoxaparin (LOVENOX) injection  40 mg Subcutaneous Q24H   feeding supplement  237 mL Oral BID BM   folic acid  1 mg Oral Daily   multivitamin with minerals  1 tablet Oral Daily   nicotine  14 mg Transdermal Q0600   pantoprazole  40 mg Oral Q0600   sodium chloride flush  3 mL Intravenous Q12H    Continuous Infusions:  sodium chloride 10 mL/hr at 08/16/22 2220   [START ON 08/22/2022] thiamine (VITAMIN B1) injection     thiamine (VITAMIN B1) injection 500 mg (08/19/22 1702)     LOS: 2 days    Time spent: 40 minutes    Ramiro Harvest, MD [facility]   To contact the attending provider between 7A-7P or the covering provider during after hours 7P-7A, please log into the web site [email or web address] and access using universal [place] password for that web site. If you do not have the password, please call the hospital operator.  08/19/2022, 5:07 PM

## 2022-08-19 NOTE — Progress Notes (Signed)
Physical Therapy Treatment Patient Details Name: Gary Mathis MRN: [id number] DOB: 1955-11-15 Today's Date: 08/19/2022   History of Present Illness Patient is 67 year old gentleman found down at home by sister and presenting with generalized weakness and altered mental status/confusion with some visual hallucinations. Patient noted to have intermittent confusion ongoing for the past 1 to 2 weeks which had worsened over the past 2 to 3 days prior to admission. Patient seen in the ED CT head done negative for any acute abnormalities. CT C-spine negative for any acute abnormalities. Patient admitted with concerns for possible Warnicke's encephalopathy placed on IV thiamine, IV fluids, Ativan withdrawal protocol. PMH: hypertension, hyperlipidemia, low back pain, alcohol abuse.    PT Comments  Pt received in supine, lethargic and unable to maintain eyes open and sidelying to R in fetal position but agreeable to therapy session. Pt with fair tolerance for repositioning for comfort/improved pulmonary clearance and bil LE ROM/stretching but needing active assist for all movements today due to lethargy. Pt unable to take sips/bites of dinner tray due to lethargy, RN notified. Recommend staff reposition/roll pt Q2H for pressure relief while he is more lethargic. Pt continues to benefit from PT services to progress toward functional mobility goals, plan on working on transfers/OOB mobility next session if pt more alert and following commands better.     Assistance Recommended at Discharge Frequent or constant Supervision/Assistance  If plan is discharge home, recommend the following:  Can travel by private vehicle    A lot of help with bathing/dressing/bathroom;Assistance with cooking/housework;Two people to help with walking and/or transfers;Assistance with feeding;Direct supervision/assist for medications management;Direct supervision/assist for financial management;Assist for transportation;Help with stairs  or ramp for entrance   No  Equipment Recommendations  Rolling walker (2 wheels);BSC/3in1 (pending progress)    Recommendations for Other Services       Precautions / Restrictions Precautions Precautions: Fall Precaution Comments: CIWA Restrictions Weight Bearing Restrictions: No     Mobility  Bed Mobility Overal bed mobility: Needs Assistance Bed Mobility: Rolling, Supine to Sit Rolling: Max assist   Supine to sit: Total assist     General bed mobility comments: Pt requires cues for sequencing and poor carryover due to lethargy; long sit briefly with max to totalA, pt unable to maintain, defer EOB due to significant lethargy and unsafe to attempt.    Transfers                   General transfer comment: defer, pt too lethargic to attempt today.    Ambulation/Gait                   Stairs             Wheelchair Mobility     Tilt Bed    Modified Rankin (Stroke Patients Only)       Balance Overall balance assessment: Needs assistance Sitting-balance support: Single extremity supported, Feet unsupported Sitting balance-Leahy Scale: Zero Sitting balance - Comments: max to totalA for long sitting due to AMS/lethargy; defer EOb       Standing balance comment: defer due to pt lethargy                            Cognition Arousal/Alertness: Lethargic, Suspect due to medications Behavior During Therapy: WFL for tasks assessed/performed Overall Cognitive Status: Impaired/Different from baseline Area of Impairment: Attention, Following commands, Safety/judgement, Awareness, Problem solving, Orientation  Orientation Level: Disoriented to, Place, Time, Situation Current Attention Level: Focused   Following Commands: Follows one step commands inconsistently Safety/Judgement: Decreased awareness of safety, Decreased awareness of deficits Awareness: Intellectual Problem Solving: Slow processing, Difficulty  sequencing, Requires verbal cues, Requires tactile cues, Decreased initiation General Comments: Limited assessment today due to increased lethargy after RN had to give ativan for pt restlessness. Pt maintains eyes closed, when pt encouraged to take a sip of his drink through his straw, he only is able to bring liquid halfway up straw. Deferred further eating/drinking attempts as pt unable to rouse sufficiently to safely consume, RN notified.        Exercises General Exercises - Lower Extremity Ankle Circles/Pumps: AROM, AAROM, Both, 10 reps, Supine Short Arc Quad: AAROM, Both, 10 reps, Supine Heel Slides: AAROM, Both, 10 reps, Supine Hip ABduction/ADduction: AAROM, Both, 5 reps, Supine Other Exercises Other Exercises: cues for pt to use bed side rails to pull himself to L/R sides for lateral leans and repostioning in bed chair posture, with poor caryover due to lethargy (pt maxA to totalA to perform x2 trials) Other Exercises: supine BUE AAROM: shoulder/elbow flex/ext as able in bed chair posture ~5 reps ea, nearly PROM due to pt fatigue Other Exercises: bil heel cord stretch x30 sec ea x2 reps    General Comments General comments (skin integrity, edema, etc.): heels floated for pressure relief, bed placed in chair posture for improved pulmonary clearance and to promote alertness, pillow under LUE due to L lean with upright HOB.      Pertinent Vitals/Pain Pain Assessment Pain Assessment: PAINAD Breathing: normal Negative Vocalization: none Facial Expression: sad, frightened, frown Body Language: tense, distressed pacing, fidgeting Consolability: no need to console PAINAD Score: 2 Pain Intervention(s): Monitored during session, Limited activity within patient's tolerance, Repositioned    Home Living                          Prior Function            PT Goals (current goals can now be found in the care plan section) Acute Rehab PT Goals Patient Stated Goal: To rest PT  Goal Formulation: With patient/family Time For Goal Achievement: 08/31/22 Progress towards PT goals: Progressing toward goals (slowly; CIWA/ativan limiting tolerance)    Frequency    Min 3X/week      PT Plan Current plan remains appropriate    Co-evaluation              AM-PAC PT "6 Clicks" Mobility   Outcome Measure  Help needed turning from your back to your side while in a flat bed without using bedrails?: A Lot Help needed moving from lying on your back to sitting on the side of a flat bed without using bedrails?: Total Help needed moving to and from a bed to a chair (including a wheelchair)?: Total Help needed standing up from a chair using your arms (e.g., wheelchair or bedside chair)?: Total Help needed to walk in hospital room?: Total Help needed climbing 3-5 steps with a railing? : Total 6 Click Score: 7    End of Session   Activity Tolerance: Patient limited by lethargy Patient left: in bed;with call bell/phone within reach;with bed alarm set;Other (comment) (bed in chair posture, heels floated) Nurse Communication: Mobility status;Other (comment) (pt too lethargic to eat dinner or drink liquids after Ativan) PT Visit Diagnosis: Unsteadiness on feet (R26.81);Other abnormalities of gait and mobility (R26.89);History of falling (Z91.81)  Time: 1610-9604 PT Time Calculation (min) (ACUTE ONLY): 16 min  Charges:    $Therapeutic Exercise: 8-22 mins PT General Charges $$ ACUTE PT VISIT: 1 Visit                     Lindzee Gouge P., PTA Acute Rehabilitation Services Secure Chat Preferred 9a-5:30pm Office: [phone]    Dorathy Kinsman [facility] 08/19/2022, 6:46 PM

## 2022-08-19 NOTE — Progress Notes (Signed)
Initial Nutrition Assessment  DOCUMENTATION CODES:   Severe malnutrition in context of chronic illness, Underweight  INTERVENTION:  Continue Ensure Plus High Protein po BID, each supplement provides 350 kcal and 20 grams of protein.  Encourage po intake  Continue regular diet as tolerated   Continue Thiamine   RD recommends IVF's if patient is not adequately consuming po liquids     NUTRITION DIAGNOSIS:   Severe Malnutrition related to social / environmental circumstances as evidenced by severe muscle depletion, severe fat depletion.   GOAL:   Patient will meet greater than or equal to 90% of their needs   MONITOR:   PO intake, Supplement acceptance, Labs, Weight trends, Skin, I & O's  REASON FOR ASSESSMENT:   Malnutrition Screening Tool    ASSESSMENT:   67 y.o. male with PMHx including HTN, HLD, low back pain, alcohol abuse who present with weakness and AMS  Visited patient at bedside who was sleeping and wearing mitten restraints. His sister/POA was at bedside. She reports he has not been eating well for a while now. Patient was living by himself and she would check on him everyday and encorage him to eat whenever she saw him, but he wouldn't, she reported. She reports he drinks some of the Ensure but it is a challenge to get him to eat or drink anything.   Concerns for dementia and Wernicke's encephalopathy  Patient visually hallucinating   Plans for SNF   Labs: reviewed  Meds: Ensure BID, folvite, protonix, NS, thiamine  Wt: limited wt hx; 12.1 kg (17%) wt loss in 2 years (wt loss could have happened quicker than this) 08/16/22 60.5 kg  04/09/20 72.6 kg  10/09/11 68.9 kg   Po: 33% avg po x last 6 documented meals  I/O's:  -833 ml (net)   NUTRITION - FOCUSED PHYSICAL EXAM:  Flowsheet Row Most Recent Value  Orbital Region Severe depletion  Upper Arm Region Severe depletion  Buccal Region Severe depletion  Temple Region Severe depletion  Clavicle  Bone Region Severe depletion  Clavicle and Acromion Bone Region Severe depletion  Dorsal Hand Severe depletion  Patellar Region Severe depletion  Anterior Thigh Region Severe depletion  Posterior Calf Region Severe depletion  Edema (RD Assessment) None  Hair Reviewed  Eyes Unable to assess  Mouth Unable to assess  Skin Reviewed  Nails Unable to assess       Diet Order:   Diet Order             Diet regular Room service appropriate? Yes; Fluid consistency: Thin  Diet effective now                   EDUCATION NEEDS:   No education needs have been identified at this time  Skin:  Skin Assessment: Reviewed RN Assessment  Last BM:  7/9  Height:   Ht Readings from Last 1 Encounters:  08/16/22 5\' 11"  (1.803 m)    Weight:   Wt Readings from Last 1 Encounters:  08/16/22 60.5 kg    Ideal Body Weight:     BMI:  Body mass index is 18.6 kg/m.  Estimated Nutritional Needs:   Kcal:  1610-9604  Protein:  70-90 g protein  Fluid:  >/= 2L    Leodis Rains, RDN, LDN  Clinical Nutrition

## 2022-08-20 ENCOUNTER — Inpatient Hospital Stay (HOSPITAL_COMMUNITY)
Admit: 2022-08-20 | Discharge: 2022-08-20 | Disposition: A | Discharge status: Home / Self Care | Payer: Medicare HMO | Attending: Internal Medicine | Admitting: Internal Medicine

## 2022-08-20 DIAGNOSIS — I10 Essential (primary) hypertension: Secondary | ICD-10-CM | POA: Diagnosis not present

## 2022-08-20 DIAGNOSIS — F1010 Alcohol abuse, uncomplicated: Secondary | ICD-10-CM | POA: Diagnosis not present

## 2022-08-20 DIAGNOSIS — R441 Visual hallucinations: Secondary | ICD-10-CM | POA: Diagnosis not present

## 2022-08-20 DIAGNOSIS — E43 Unspecified severe protein-calorie malnutrition: Secondary | ICD-10-CM

## 2022-08-20 DIAGNOSIS — R569 Unspecified convulsions: Secondary | ICD-10-CM

## 2022-08-20 DIAGNOSIS — G9340 Encephalopathy, unspecified: Secondary | ICD-10-CM | POA: Diagnosis not present

## 2022-08-20 LAB — BASIC METABOLIC PANEL
Anion gap: 11 (ref 5–15)
BUN: 14 mg/dL (ref 8–23)
CO2: 22 mmol/L (ref 22–32)
Calcium: 9.3 mg/dL (ref 8.9–10.3)
Chloride: 104 mmol/L (ref 98–111)
Creatinine, Ser: 0.93 mg/dL (ref 0.61–1.24)
GFR, Estimated: >60 mL/min (ref >60)
Glucose, Bld: 108 mg/dL — ABNORMAL HIGH (ref 70–99)
Potassium: 3.9 mmol/L (ref 3.5–5.1)
Sodium: 137 mmol/L (ref 135–145)

## 2022-08-20 LAB — MAGNESIUM: Magnesium: 2.1 mg/dL (ref 1.7–2.4)

## 2022-08-20 LAB — CBC
HCT: 44.3 % (ref 39.0–52.0)
Hemoglobin: 15.3 g/dL (ref 13.0–17.0)
MCH: 32.3 pg (ref 26.0–34.0)
MCHC: 34.5 g/dL (ref 30.0–36.0)
MCV: 93.7 fL (ref 80.0–100.0)
Platelets: 289 10*3/uL (ref 150–400)
RBC: 4.73 MIL/uL (ref 4.22–5.81)
RDW: 12.6 % (ref 11.5–15.5)
WBC: 9.4 10*3/uL (ref 4.0–10.5)
nRBC: 0 % (ref 0.0–0.2)

## 2022-08-20 LAB — HEPATIC FUNCTION PANEL
ALT: 24 U/L (ref 0–44)
AST: 27 U/L (ref 15–41)
Albumin: 3.7 g/dL (ref 3.5–5.0)
Alkaline Phosphatase: 60 U/L (ref 38–126)
Bilirubin, Direct: 0.1 mg/dL (ref 0.0–0.2)
Indirect Bilirubin: 0.7 mg/dL (ref 0.3–0.9)
Total Bilirubin: 0.8 mg/dL (ref 0.3–1.2)
Total Protein: 6.6 g/dL (ref 6.5–8.1)

## 2022-08-20 LAB — CK: Total CK: 239 U/L (ref 49–397)

## 2022-08-20 LAB — PHOSPHORUS: Phosphorus: 4.3 mg/dL (ref 2.5–4.6)

## 2022-08-20 MED ORDER — THIAMINE MONONITRATE 100 MG PO TABS: 100.0000 mg | ORAL_TABLET | Freq: Every day | ORAL | Status: DC

## 2022-08-20 MED ORDER — HALOPERIDOL 1 MG PO TABS: 2.0000 mg | ORAL_TABLET | Freq: Four times a day (QID) | ORAL | Status: DC | PRN

## 2022-08-20 MED ORDER — THIAMINE HCL 100 MG/ML IJ SOLN
250.0000 mg | Freq: Every day | INTRAVENOUS | Status: DC
  Filled 2022-08-20: qty 2.5

## 2022-08-20 MED ORDER — HALOPERIDOL LACTATE 5 MG/ML IJ SOLN
2.0000 mg | Freq: Four times a day (QID) | INTRAMUSCULAR | Status: DC | PRN
  Administered 2022-08-20: 2 mg via INTRAMUSCULAR
  Filled 2022-08-20: qty 1

## 2022-08-20 MED ORDER — POTASSIUM CHLORIDE CRYS ER 20 MEQ PO TBCR
40.0000 meq | EXTENDED_RELEASE_TABLET | Freq: Two times a day (BID) | ORAL | Status: AC
  Administered 2022-08-20 (×2): 40 meq via ORAL
  Filled 2022-08-20 (×2): qty 2

## 2022-08-20 NOTE — Hospital Course (Addendum)
Patient is a 67 year old Caucasian male with past medical history significant for but limited to hypertension, hyperlipidemia low back pain as well as alcohol abuse and other comorbidities presented with generalized weakness and altered mental status with confusion along with associated visual hallucinations.  He has been having intermittent confusion last 1 to 2 weeks which is worsened over the last 2 to 3 days prior to admission.  In the ED head CT was done and showed no acute abnormalities and CT cervical spine done and showed no acute abnormalities.  Per report the patient was found on the floor by his sister and he was admitted with concerns for possible Warnicke's encephalopathy and placed on IV thiamine, fluids and Ativan withdrawal protocol.  He continues to hallucinate and workup has been fairly unremarkable.    Continues to not eat and has been hallucinating intermittently.  Remains somnolent and has been mumbling.  Neurology was consulted and recommending outpatient evaluation for cognitive issues and also palliative consultation.  Palliative consulted and goals of care meeting happening tomorrow.  Patient may likely be transition to comfort care pending the goal of care conversations tomorrow.  Assessment and Plan:  Acute Metabolic Encephalopathy/Visual Hallucinations -Patient presented with generalized weakness, ongoing confusion worsened over the past 2 to 3 days and now with some visual hallucinations. -On 08/17/2022 patient noted to have some visual hallucinations stating that he was seen cats and mice playing under his bed.   -Patient denies any further visual hallucinations on 08/18/2022 however persisted patient with some visual hallucinations today 08/19/2022.. -Patient noted to have been found down at home.  -His sister when she went to check up on him on the day of admission and patient also noted to have some recent weight loss with concerns for poor nutrition. -Patient with history of  alcohol use concern presentation may be secondary to alcohol withdrawal in the setting of possible Wernicke's encephalopathy. -Concern that patient may also have an underlying dementia.  Sister concern patient may have possible underlying dementia as she also states prior to admission patient had been having some bouts of sundowning. -Patient with a leukocytosis with no signs of obvious infection which has since resolved. -WBC Trend: Recent Labs  Lab 08/16/22 1144 08/17/22 0038 08/18/22 0851 08/19/22 0708 08/20/22 0101  WBC 15.9* 10.2 8.9 8.6 9.4  -Chest x-ray negative for any acute infiltrates. -Urinalysis nitrite negative, leukocytes negative. -TSH within normal limits, vitamin B12 of 475, ammonia level on admission was 35, -Thiamine level as below. -RPR nonreactive.   -Patient afebrile. -MRI brain negative for any acute abnormalities but did show "There is patchy and confluent FLAIR signal abnormality in the supratentorial white matter likely reflecting sequela of moderate chronic small-vessel ischemic change, advanced for age. 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 -Continued Ativan withdrawal protocol. -Continue Librium detox protocol. -Checking EEG and showed "This study is suggestive of moderate diffuse encephalopathy, nonspecific etiology. No seizures or epileptiform discharges were seen throughout the recording." -If no significant improvement after completion of Librium detox protocol may consider neurology input. 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 -Neuro consulted for futher evaluation; recommended obtaining a cognitive evaluation by SLP and continue PT OT as well as  recommending  outpatient neurology evaluation for mental evaluation of gradual cognitive and memory decline. -Neurology also recommended palliative care consultation given his cognitive decline and decline in ability to physically care for himself -Palliative care medicine consulted for goals of care discussion was had and patient was changed to DNR/DNI.  Patient's sister is interested in more about discussion about transition to comfort measures and consideration of hospice facility and patient's sisters met with Palliative Care and patient is being transitioned to Comfort Care  Mild Rhabdomyolysis -Patient's CK level went from 1434 -> 239 -IVF now stopped   Alcohol abuse/?  Alcohol withdrawal -Patient stated drinks about a 40 ounce of beer daily. -Last alcohol use was 3 days prior to admission. -Patient presented with confusion, generalized weakness, concern for possible alcohol withdrawal. -Thiamine level was 293.6. -Continued high-dose IV thiamine and is getting 500 mg TID x5 Days and then 250 mg Daily x5 Days -Continued Ativan withdrawal protocol. -Continued Clordiazepoxide 25 mg po Dailu detox protocol.   -Continue folic acid, multivitamin.   -IV fluids now Stopped    Generalized Weakness/Recurrent Falls -Head CT negative for any acute abnormalities. -MRI of brain negative for any acute abnormalities but did show "There is patchy and confluent FLAIR signal abnormality in the supratentorial white matter likely reflecting sequela of moderate chronic small-vessel ischemic change, advanced for age. 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 -PT/OT has assessed patient and recommended SNF placement.   Hyperlipidemia -Continue to hold Statin as patient presented with generalized weakness and fatigue. -Would likely resume statin on discharge.   Hypertension -Continue Amlodipine 10 mg po Daily.   -Continue to hold ARB.  -Continue to Monitor  BP per Protocol -Last BP reading was 116/76   Leukocytosis -Likely reactive leukocytosis and now improved. -Patient afebrile. -WBC Trend: Recent Labs  Lab 08/16/22 1144 08/17/22 0038 08/18/22 0851 08/19/22 0708 08/20/22 0101  WBC 15.9* 10.2 8.9 8.6 9.4  -Chest x-ray negative for any acute infiltrate. -Urinalysis not consistent with UTI. -Leukocytosis has improved.   -Continue to Monitor for S/Sx of Infection -Will not Repeat CBC in the AM   Hypokalemia -Patient's K+ Level Trend: Recent Labs  Lab 08/16/22 1144 08/17/22 0038 08/18/22 0851 08/19/22 0708 08/20/22 0101  K 4.4 3.5 3.2* 3.9 3.9  -Replete with po KCL 40 mEQ BID -Continue to Monitor and Replete as Necessary -Will not Repeat CMP in the AM   Tobacco Abuse -C/w Nicotine Patch 14 mg TD q24h and Nicotine Polacrilex 2 mg poPRN Smoking Cessastion  Severe Protein Calorie Malnutrition Nutrition Status: Nutrition Problem: Severe Malnutrition Etiology: social / environmental circumstances Signs/Symptoms: severe muscle depletion, severe fat depletion Interventions: Ensure Enlive (each supplement provides 350kcal and 20 grams of protein), Refer to RD note for recommendations   Hypoalbuminemia -Patient's Albumin Trend: Recent Labs  Lab 08/16/22 1600 08/17/22 0038 08/18/22 0851 08/20/22 0101  ALBUMIN 4.3 3.4* 3.4* 3.7  -Will not Continue to Monitor and Trend and repeat CMP in the AM  **After further goals of care discussion today the patient family decided to transition the patient to comfort care.  All medications not in the patient's comfort have been discontinued and patient was started on low-dose Ativan and morphine around-the-clock for agitation and chronic back pain.  Plan is not to transition the patient to residential hospice to be given place and he is medically stable for discharge at this time.

## 2022-08-20 NOTE — Procedures (Signed)
Patient Name: ELSON ULBRICH  MRN: [id number]  Epilepsy Attending: Charlsie Quest  Referring Physician/Provider: Merlene Laughter, DO  Date: 08/20/2022 Duration: 22.43 mins  Patient history: 67yo m with ams getting eeg to evaluate for seizure  Level of alertness: Awake  AEDs during EEG study: Chlordiazepoxide  Technical aspects: This EEG study was done with scalp electrodes positioned according to the 10-20 International system of electrode placement. Electrical activity was reviewed with band pass filter of 1-70Hz , sensitivity of 7 uV/mm, display speed of 39mm/sec with a 60Hz  notched filter applied as appropriate. EEG data were recorded continuously and digitally stored.  Video monitoring was available and reviewed as appropriate.  Description: EEG showed continuous generalized 3 to 6 Hz theta-delta slowing admixed with 13-15Hz  generalized beta activity. Hyperventilation and photic stimulation were not performed.     ABNORMALITY - Continuous slow, generalized  IMPRESSION: This study is suggestive of moderate diffuse encephalopathy, nonspecific etiology. No seizures or epileptiform discharges were seen throughout the recording.  Gary Mathis Annabelle Harman

## 2022-08-20 NOTE — Care Management Important Message (Signed)
Important Message  Patient Details  Name: Gary Mathis MRN: [id number] Date of Birth: [birth year]   Medicare Important Message Given:  Yes     Renie Ora 08/20/2022, 8:30 AM

## 2022-08-20 NOTE — Progress Notes (Signed)
Occupational Therapy Treatment Patient Details Name: Gary Mathis MRN: [id number] DOB: 10-03-1955 Today's Date: 08/20/2022   History of present illness Patient is 67 year old gentleman found down at home by sister and presenting with generalized weakness and altered mental status/confusion with some visual hallucinations. Patient noted to have intermittent confusion ongoing for the past 1 to 2 weeks which had worsened over the past 2 to 3 days prior to admission. Patient seen in the ED CT head done negative for any acute abnormalities. CT C-spine negative for any acute abnormalities. Patient admitted with concerns for possible Warnicke's encephalopathy placed on IV thiamine, IV fluids, Ativan withdrawal protocol. PMH: hypertension, hyperlipidemia, low back pain, alcohol abuse.   OT comments  Pt asleep supine in bed with HOB flat and sister and friend present upon OT arrival. Pt slow to wake and difficult to alert throughout session with HOB elevated and Max encouragement. Pt with significantly decreased alertness and cognition and increased lethargy and fatigue this session as compared to skilled OT eval on 7/7. Pt with limited ability to participate in skilled OT session this day. Pt currently demonstrates ability to complete UB ADLs with Max assist, LB ADLs with Total assist +2, and rolling in the bed with Max assist +2. OT to reassess pt next session to determine if OT plan remains appropriate and if goals need to be updated. Pt will benefit from continued acute skilled OT services to address deficits, decrease caregiver burden, and increase safety and independence with ADLs, bed mobility during/in preparation for ADLs, and functional transfers. Post acute discharge, pt will benefit from intensive inpatient skilled OT services < 3 hours per day to maximize rehab potential.    Recommendations for follow up therapy are one component of a multi-disciplinary discharge planning process, led by the  attending physician.  Recommendations may be updated based on patient status, additional functional criteria and insurance authorization.    Assistance Recommended at Discharge Frequent or constant Supervision/Assistance  Patient can return home with the following  Two people to help with walking and/or transfers;Two people to help with bathing/dressing/bathroom;Assistance with cooking/housework;Assistance with feeding;Direct supervision/assist for medications management;Direct supervision/assist for financial management;Assist for transportation;Help with stairs or ramp for entrance   Equipment Recommendations   (Defer to next level of care)    Recommendations for Other Services      Precautions / Restrictions Precautions Precautions: Fall Precaution Comments: CIWA Restrictions Weight Bearing Restrictions: No       Mobility Bed Mobility Overal bed mobility: Needs Assistance Bed Mobility: Rolling Rolling: Max assist, +2 for physical assistance         General bed mobility comments: with cues for hand placement, technique, sequencing, and mainting attention to task    Transfers                   General transfer comment: Deferred for pt/therapist safety secondaty to pt lethergy, fatigue, decreased alertness, and decreased cognition this session.     Balance                                           ADL either performed or assessed with clinical judgement   ADL Overall ADL's : Needs assistance/impaired Eating/Feeding: Maximal assistance;Bed level;Cueing for sequencing (with HOB elevated) Eating/Feeding Details (indicate cue type and reason): Pt requiring Max assist of sister for feeding this day with pt making very limited attempt to  self feeding/hold fork with Max encouragement, extra time, and cues for sequencing, initiation, and maintaining attention to task. Grooming: Therapist, nutritional;Wash/dry hands;Maximal assistance;Bed level;Cueing for  sequencing (with HOB elevated) Grooming Details (indicate cue type and reason): With Max encouragement, extra time, and cues for sequencing, initiation, and maintaining attention to task. Upper Body Bathing: Total assistance;Bed level   Lower Body Bathing: Total assistance;Bed level;+2 for physical assistance   Upper Body Dressing : Maximal assistance;Bed level   Lower Body Dressing: Total assistance;+2 for physical assistance;Bed level       Toileting- Clothing Manipulation and Hygiene: Total assistance;+2 for physical assistance;Bed level         General ADL Comments: Pt with significant decline in functional level as compared to skilled OT eval on 08/17/22. OT to reassess next session and determine if goals need to be updated.    Extremity/Trunk Assessment Upper Extremity Assessment Upper Extremity Assessment: Generalized weakness;RUE deficits/detail;LUE deficits/detail RUE Coordination: decreased fine motor;decreased Wolters motor LUE Sensation: decreased proprioception LUE Coordination: decreased fine motor;decreased Group motor   Lower Extremity Assessment Lower Extremity Assessment: Defer to PT evaluation        Vision   Additional Comments: Attempted to perform vision scree with pt this session. However, pt with limited ability to participate. Pt apears to have mild Left side visual field deficits and Right side visual field WFL. Pt unable to follow commands for visual tracking screen this day.   Perception     Praxis      Cognition Arousal/Alertness: Lethargic Behavior During Therapy: WFL for tasks assessed/performed Overall Cognitive Status: Impaired/Different from baseline Area of Impairment: Orientation, Attention, Memory, Following commands, Safety/judgement, Awareness, Problem solving                 Orientation Level: Disoriented to, Time, Situation Current Attention Level: Focused Memory: Decreased short-term memory Following Commands: Follows one  step commands inconsistently, Follows one step commands with increased time Safety/Judgement: Decreased awareness of safety, Decreased awareness of deficits Awareness: Intellectual Problem Solving: Slow processing, Difficulty sequencing, Requires verbal cues, Requires tactile cues, Decreased initiation General Comments: Pt with significantly increased lethergy and fatigued and decreased alertness and cognition this day as compared to skilled OT evaluation on 08/17/22. RN notified with RN stating pt had increased agitiation past two days requiring medical intervention on 08/19/22 and with significant lethergy, fatigue, and sleepiness this day.        Exercises      Shoulder Instructions       General Comments VSS on RA throughout session. Heels floated in the bed for pressure releif and HOB elevated for increased alertness and pulmonary clearance. Pt's friend present through a portion of the session. Pt's sister present throughout session.    Pertinent Vitals/ Pain       Pain Assessment Pain Assessment: No/denies pain Pain Intervention(s): Monitored during session  Home Living                                          Prior Functioning/Environment              Frequency           Progress Toward Goals  OT Goals(current goals can now be found in the care plan section)  Progress towards OT goals: OT to reassess next treatment  Acute Rehab OT Goals Patient Stated Goal: Pt unable to state this session.  Plan  Other (comment) (Ot to reassess pt next session to determine if plan remains appropriate or needs to be updated.)    Co-evaluation                 AM-PAC OT "6 Clicks" Daily Activity     Outcome Measure   Help from another person eating meals?: A Lot Help from another person taking care of personal grooming?: A Lot Help from another person toileting, which includes using toliet, bedpan, or urinal?: Total Help from another person bathing  (including washing, rinsing, drying)?: A Lot Help from another person to put on and taking off regular upper body clothing?: A Lot Help from another person to put on and taking off regular lower body clothing?: Total 6 Click Score: 10    End of Session    OT Visit Diagnosis: Muscle weakness (generalized) (M62.81);Other symptoms and signs involving cognitive function   Activity Tolerance Patient limited by fatigue;Patient limited by lethargy;Other (comment) (Patient limited by low level of alertness and decreased cognition this session.)   Patient Left in bed;with call bell/phone within reach;with bed alarm set;with family/visitor present   Nurse Communication Mobility status;Other (comment) (Pt with significant change in alertness, cognition, and functional level as compared to skilled OT eval on 08/17/22.)        Time: 1222-1241 OT Time Calculation (min): 19 min  Charges: OT General Charges $OT Visit: 1 Visit OT Treatments $Self Care/Home Management : 8-22 mins  Renarda Mullinix "Orson Eva., OTR/L, MA Acute Rehab [phone]   Lendon Colonel 08/20/2022, 3:09 PM

## 2022-08-20 NOTE — Progress Notes (Signed)
PROGRESS NOTE    Gary Mathis  NGE:[id number] DOB: [birth year] DOA: 08/16/2022 PCP: Patient, No Pcp Per   Brief Narrative:  Patient is a 67 year old Caucasian male with past medical history significant for but limited to hypertension, hyperlipidemia low back pain as well as alcohol abuse and other comorbidities presented with generalized weakness and altered mental status with confusion along with associated visual hallucinations.  He has been having intermittent confusion last 1 to 2 weeks which is worsened over the last 2 to 3 days prior to admission.  In the ED head CT was done and showed no acute abnormalities and CT cervical spine done and showed no acute abnormalities.  Per report the patient was found on the floor by his sister and he was admitted with concerns for possible Warnicke's encephalopathy and placed on IV thiamine, fluids and Ativan withdrawal protocol.  He continues to hallucinate and workup has been fairly unremarkable.  Continues to hallucinate and was somnolent and little drowsy today.  Assessment and Plan:  Acute Metabolic Encephalopathy/Visual Hallucinations -Patient presented with generalized weakness, ongoing confusion worsened over the past 2 to 3 days and now with some visual hallucinations. -On 08/17/2022 patient noted to have some visual hallucinations stating that he was seen cats and mice playing under his bed.   -Patient denies any further visual hallucinations on 08/18/2022 however persisted patient with some visual hallucinations today 08/19/2022.. -Patient noted to have been found down at home.  -His sister when she went to check up on him on the day of admission and patient also noted to have some recent weight loss with concerns for poor nutrition. -Patient with history of alcohol use concern presentation may be secondary to alcohol withdrawal in the setting of possible Wernicke's encephalopathy. -Concern that patient may also have an underlying dementia.  Sister  concern patient may have possible underlying dementia as she also states prior to admission patient had been having some bouts of sundowning. -Patient with a leukocytosis with no signs of obvious infection which has since resolved. -WBC Trend: Recent Labs  Lab 08/16/22 1144 08/17/22 0038 08/18/22 0851 08/19/22 0708 08/20/22 0101  WBC 15.9* 10.2 8.9 8.6 9.4  -Chest x-ray negative for any acute infiltrates. -Urinalysis nitrite negative, leukocytes negative. -TSH within normal limits, vitamin B12 of 475, ammonia level on admission was 35, -Thiamine level as below. -RPR nonreactive.   -Patient afebrile. -MRI brain negative for any acute abnormalities but did show "There is patchy and confluent FLAIR signal abnormality in the supratentorial white matter likely reflecting sequela of moderate chronic small-vessel ischemic change, advanced for age. 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 -Continued Ativan withdrawal protocol. -Continue Librium detox protocol. -Checking EEG -If no significant improvement after completion of Librium detox protocol may consider neurology input. 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  Mild Rhabdomyolysis -Patient's CK level went from 1434 -> 239 -IVF now stopped   Alcohol abuse/?  Alcohol withdrawal -Patient stated drinks about a 40 ounce of beer daily. -Last alcohol use was 3 days prior to admission. -Patient presented with confusion, generalized weakness, concern for possible alcohol withdrawal. -Thiamine level was 293.6. -Continued high-dose IV thiamine and is getting 500 mg TID x5  Days and then 250 mg Daily x5 Days -Continued Ativan withdrawal protocol. -Continued  Clordiazepoxide 25 mg po Dailu detox protocol.   -Continue folic acid, multivitamin.   -IV fluids now Stopped    Generalized Weakness/Recurrent Falls -Head CT negative for any acute abnormalities. -MRI of brain negative for any acute abnormalities but did show "There is patchy and confluent FLAIR signal abnormality in the supratentorial white matter likely reflecting sequela of moderate chronic small-vessel ischemic change, advanced for age. 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 -PT/OT has assessed patient and recommended SNF placement.   Hyperlipidemia -Continue to hold Statin as patient presented with generalized weakness and fatigue. -Would likely resume statin on discharge.   Hypertension -Continue Amlodipine 10 mg po Daily.   -Continue to hold ARB.  -Continue to Monitor BP per Protocol -Last BP reading was 142/88   Leukocytosis -Likely reactive leukocytosis and now improved. -Patient afebrile. -WBC Trend: Recent Labs  Lab 08/16/22 1144 08/17/22 0038 08/18/22 0851 08/19/22 0708 08/20/22 0101  WBC 15.9* 10.2 8.9 8.6 9.4  -Chest x-ray negative for any acute infiltrate. -Urinalysis not consistent with UTI. -Leukocytosis has improved.   -Continue to Monitor for S/Sx of Infection -Repeat CBC in the AM   Hypokalemia -Patient's K+ Level Trend: Recent Labs  Lab 08/16/22 1144 08/17/22 0038 08/18/22 0851 08/19/22 0708 08/20/22 0101  K 4.4 3.5 3.2* 3.9 3.9  -Replete with po KCL 40 mEQ BID -Continue to Monitor and Replete as Necessary -Repeat CMP in the AM   Tobacco Abuse -C/w Nicotine Patch 14 mg TD q24h and Nicotine Polacrilex 2 mg poPRN Smoking Cessastion  Severe Protein Calorie Malnutrition Nutrition Status: Nutrition Problem: Severe Malnutrition Etiology: social / environmental circumstances Signs/Symptoms: severe muscle depletion, severe fat depletion Interventions: Ensure Enlive (each supplement  provides 350kcal and 20 grams of protein), Refer to RD note for recommendations   Hypoalbuminemia -Patient's Albumin Trend: Recent Labs  Lab 08/16/22 1600 08/17/22 0038 08/18/22 0851 08/20/22 0101  ALBUMIN 4.3 3.4* 3.4* 3.7  -Continue to Monitor and Trend and repeat CMP in the AM DVT prophylaxis: enoxaparin (LOVENOX) injection 40 mg Start: 08/16/22 2000    Code Status: Full Code Family Communication: Discussed with Sister at bedside  Disposition Plan:  Level of care: Telemetry Medical Status is: Inpatient Remains inpatient appropriate because: Undergoing Encephalopathy workup   Consultants:  None  Procedures:  As delineated as above  Antimicrobials:  Anti-infectives (From admission, onward)    None       Subjective: Seen and examined At bedside and sister states that he is little bit drowsy and wanting to rest and sleep.  Still continues to remain confused and she states that he is not hallucinating as bad today.  Thinks that he sundown's quite a bit.  States that he is progressively declined over the last several months.  He denies any complaints at this time.  No other concerns or complaints this time.  Objective: Vitals:   08/19/22 1636 08/19/22 2016 08/20/22 0509 08/20/22 0855  BP: 136/68 (!) 134/91 (!) 146/91 (!) 142/88  Pulse: 68 (!) 108 80 78  Resp: 18 19 17 18   Temp: 98.6 F (37 C) 98.4 F (36.9 C) 98.3 F (36.8 C) (!) 92 F (33.3 C)  TempSrc: Axillary     SpO2: 98% 95% 99% 98%  Weight:      Height:        Intake/Output Summary (Last 24 hours) at 08/20/2022 1410  Last data filed at 08/20/2022 0843 [place] per 24 hour  Intake 33 ml  Output 550 ml  Net -517 ml   Filed Weights   08/16/22 1150 08/16/22 1731  Weight: 49.9 kg 60.5 kg   Examination: Physical Exam:  Constitutional: Thin Caucasian male in NAD Respiratory: Diminished to auscultation bilaterally, no wheezing, rales, rhonchi or crackles. Normal respiratory effort and patient is not  tachypenic. No accessory muscle use. Unlabored breathing  Cardiovascular: RRR, no murmurs / rubs / gallops. S1 and S2 auscultated. No extremity edema. Abdomen: Soft, non-tender, non-distended. Bowel sounds positive.  GU: Deferred. Musculoskeletal: No clubbing / cyanosis of digits/nails. No joint deformity upper and lower extremities.  Skin: No rashes, lesions, ulcers on a limited skin evaluation. No induration; Warm and dry.  Neurologic: Somnolent and drowsy.  Psychiatric: Impaired judgment and insight.    Data Reviewed: I have personally reviewed following labs and imaging studies  CBC: Recent Labs  Lab 08/16/22 1144 08/17/22 0038 08/18/22 0851 08/19/22 0708 08/20/22 0101  WBC 15.9* 10.2 8.9 8.6 9.4  HGB 14.4 12.8* 13.4 15.2 15.3  HCT 42.0 38.1* 39.9 44.9 44.3  MCV 93.1 94.1 95.9 93.5 93.7  PLT 312 268 266 280 289   Basic Metabolic Panel: Recent Labs  Lab 08/16/22 1144 08/16/22 1600 08/17/22 0038 08/18/22 0851 08/19/22 0708 08/20/22 0101  NA 131*  --  134* 139 137 137  K 4.4  --  3.5 3.2* 3.9 3.9  CL 97*  --  103 104 102 104  CO2 20*  --  25 27 23 22   GLUCOSE 100*  --  96 95 89 108*  BUN 24*  --  16 9 9 14   CREATININE 1.12  --  1.05 0.83 0.79 0.93  CALCIUM 9.4  --  8.7* 8.6* 9.3 9.3  MG  --  2.2  --  1.9  --  2.1  PHOS  --  3.6  --  2.7  --  4.3   GFR: Estimated Creatinine Clearance: 66.9 mL/min (by C-G formula based on SCr of 0.93 mg/dL). Liver Function Tests: Recent Labs  Lab 08/16/22 1600 08/17/22 0038 08/18/22 0851 08/20/22 0101  AST 47* 50*  --  27  ALT 35 29  --  24  ALKPHOS 63 52  --  60  BILITOT 0.9 0.8  --  0.8  PROT 7.1 5.8*  --  6.6  ALBUMIN 4.3 3.4* 3.4* 3.7   No results for input(s): "LIPASE", "AMYLASE" in the last 168 hours. Recent Labs  Lab 08/16/22 1453  AMMONIA 35   Coagulation Profile: No results for input(s): "INR", "PROTIME" in the last 168 hours. Cardiac Enzymes: Recent Labs  Lab 08/16/22 1600 08/19/22 0708 08/20/22 0101   CKTOTAL 1,434* 419* 239   BNP (last 3 results) No results for input(s): "PROBNP" in the last 8760 hours. HbA1C: No results for input(s): "HGBA1C" in the last 72 hours. CBG: Recent Labs  Lab 08/16/22 1149  GLUCAP 99   Lipid Profile: No results for input(s): "CHOL", "HDL", "LDLCALC", "TRIG", "CHOLHDL", "LDLDIRECT" in the last 72 hours. Thyroid Function Tests: No results for input(s): "TSH", "T4TOTAL", "FREET4", "T3FREE", "THYROIDAB" in the last 72 hours. Anemia Panel: No results for input(s): "VITAMINB12", "FOLATE", "FERRITIN", "TIBC", "IRON", "RETICCTPCT" in the last 72 hours. Sepsis Labs: No results for input(s): "PROCALCITON", "LATICACIDVEN" in the last 168 hours.  No results found for this or any previous visit (from the past 240 hour(s)).   Radiology Studies: No results found.  Scheduled Meds:  amLODipine  10 mg Oral Daily   aspirin EC  81 mg Oral Daily   chlordiazePOXIDE  25 mg Oral BH-qamhs   Followed by   chlordiazePOXIDE  25 mg Oral Daily   enoxaparin (LOVENOX) injection  40 mg Subcutaneous Q24H   feeding supplement  237 mL Oral BID BM   folic acid  1 mg Oral Daily   multivitamin with minerals  1 tablet Oral Daily   nicotine  14 mg Transdermal Q0600   pantoprazole  40 mg Oral Q0600   potassium chloride  40 mEq Oral BID   sodium chloride flush  3 mL Intravenous Q12H   [START ON 08/27/2022] thiamine  100 mg Oral Daily   Continuous Infusions:  sodium chloride 10 mL/hr at 08/16/22 2220   [START ON 08/22/2022] thiamine (VITAMIN B1) injection     thiamine (VITAMIN B1) injection 500 mg (08/20/22 0837)    LOS: 3 days   Marguerita Merles, DO [facility] Available via Epic secure chat 7am-7pm After these hours, please refer to coverage provider listed on [email or web address] 08/20/2022, 2:10 PM

## 2022-08-20 NOTE — Progress Notes (Signed)
EEG complete - results pending 

## 2022-08-21 ENCOUNTER — Inpatient Hospital Stay (HOSPITAL_COMMUNITY): Payer: Medicare HMO

## 2022-08-21 DIAGNOSIS — Z66 Do not resuscitate: Secondary | ICD-10-CM

## 2022-08-21 DIAGNOSIS — Z7189 Other specified counseling: Secondary | ICD-10-CM

## 2022-08-21 DIAGNOSIS — Z515 Encounter for palliative care: Secondary | ICD-10-CM | POA: Diagnosis not present

## 2022-08-21 DIAGNOSIS — R531 Weakness: Secondary | ICD-10-CM | POA: Diagnosis not present

## 2022-08-21 DIAGNOSIS — F0390 Unspecified dementia without behavioral disturbance: Secondary | ICD-10-CM

## 2022-08-21 DIAGNOSIS — F1010 Alcohol abuse, uncomplicated: Secondary | ICD-10-CM | POA: Diagnosis not present

## 2022-08-21 DIAGNOSIS — I10 Essential (primary) hypertension: Secondary | ICD-10-CM | POA: Diagnosis not present

## 2022-08-21 DIAGNOSIS — G9340 Encephalopathy, unspecified: Secondary | ICD-10-CM | POA: Diagnosis not present

## 2022-08-21 DIAGNOSIS — R410 Disorientation, unspecified: Secondary | ICD-10-CM

## 2022-08-21 LAB — TROPONIN I (HIGH SENSITIVITY)
Troponin I (High Sensitivity): 15 ng/L (ref <18)
Troponin I (High Sensitivity): 16 ng/L (ref <18)
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
## 2022-08-21 NOTE — Evaluation (Signed)
Clinical/Bedside Swallow Evaluation Patient Details  Name: Gary Mathis MRN: [id number] Date of Birth: 11/01/1955  Today's Date: 08/21/2022 Time: SLP Start Time (ACUTE ONLY): 1140 SLP Stop Time (ACUTE ONLY): 1225 SLP Time Calculation (min) (ACUTE ONLY): 45 min  Past Medical History: No past medical history on file. Past Surgical History:  Past Surgical History:  Procedure Laterality Date   ANKLE FRACTURE SURGERY     metal plate with 9 scews   HPI:  67yo male admitted 08/16/22 after a fall at home, with encephalopathy, AMS, weakness. PMH: recurrent falls, HTN, HLD, low back pain, alcohol/marijuana/tobacco use, visual hallucinations. CXR negative. MRI = no acute intracranial pathology.    Assessment / Plan / Recommendation  Clinical Impression  Pt seen at bedside for assessment of swallow function and safety. Pt's sister was present during this evaluation, and reports pt has not eaten in several days, and continues to refuse PO intake. CN exam grossly WFL - pt unwilling/unable to fully participate. Speech is intermittently difficult to understand due to low volume and rapid rate of speech, as well as being edentulous. Thick secretions noted on lingual and palatal surfaces. Suction set up to facilitate thorough oral care. Successful removal of some of buttery secretions during oral care with suction. Pt required ongoing encouragement and cajoling to facilitate cooperation.   Pt accepted individual ice chips, but then exhibited oral holding. Water suctioned from oral cavity. Pt appeared to have some difficulty drawing water up through a straw, but was eventually able to take several consecutive boluses and swallowed without overt s/s aspiration. Adequate laryngeal elevation per palpation. Recommend consideration of cutting straws in half to maximize success. Pt accepted small boluses of puree, tolerating without obvious oral issues or overt s/s aspiration. Pt refused solid textures. Pt's sister  verbalized "he's done - He just wants peace". Discussed BSE results and recommendations with pt's sister, including continuing regular diet/thin liquids to allow full range of choices if pt does decide he wants to eat/drink something. Continued ST intervention is not recommended at this time.   Recommend Palliative Care consult to facilitate establishment of appropriate GOC. RN informed of results/recommendations.  SLP Visit Diagnosis: Dysphagia, unspecified (R13.10)    Aspiration Risk  Mild aspiration risk;Risk for inadequate nutrition/hydration    Diet Recommendation Thin liquid;Regular (to allow pt full range of choices)    Liquid Administration via: Cup;Straw Medication Administration: Other (Comment) (as tolerated) Supervision: Full supervision/cueing for compensatory strategies Compensations: Minimize environmental distractions;Slow rate;Small sips/bites Postural Changes: Seated upright at 90 degrees;Remain upright for at least 30 minutes after po intake    Other  Recommendations Oral Care Recommendations: Oral care BID Caregiver Recommendations: Have oral suction available    Recommendations for follow up therapy are one component of a multi-disciplinary discharge planning process, led by the attending physician.  Recommendations may be updated based on patient status, additional functional criteria and insurance authorization.  Follow up Recommendations No SLP follow up      Functional Status Assessment Patient has not had a recent decline in their functional status    Swallow Study   General Date of Onset: 08/16/22 HPI: 67yo male admitted 08/16/22 after a fall at home, with encephalopathy, AMS, weakness. PMH: recurrent falls, HTN, HLD, low back pain, alcohol/marijuana/tobacco use, visual hallucinations. CXR negative. MRI = no acute intracranial pathology. Type of Study: Bedside Swallow Evaluation Previous Swallow Assessment: none Diet Prior to this Study: Regular;Thin liquids  (Level 0) Temperature Spikes Noted: No Respiratory Status: Room air History of Recent  Intubation: No Behavior/Cognition: Alert;Uncooperative;Distractible;Requires cueing;Doesn't follow directions Oral Cavity Assessment: Excessive secretions;Other (comment) (thick buttery secretions adhering to hard and soft palates) Oral Care Completed by SLP: Yes (suction set up to facilitate thorough oral care) Oral Cavity - Dentition: Edentulous (dentures in room, not placed during evaluation) Vision:  (pt keeps eyes closed.) Self-Feeding Abilities: Total assist Patient Positioning: Upright in bed Baseline Vocal Quality: Low vocal intensity Volitional Cough: Cognitively unable to elicit (can't vs won't) Volitional Swallow: Unable to elicit (can't vs won't)    Oral/Motor/Sensory Function Overall Oral Motor/Sensory Function: Generalized oral weakness   Ice Chips Ice chips: Impaired Presentation: Spoon Oral Phase Impairments: Poor awareness of bolus;Reduced lingual movement/coordination Oral Phase Functional Implications: Oral holding Other Comments: no swallow elicited. Water suctioned from oral cavity   Thin Liquid Thin Liquid: Within functional limits Presentation: Straw Other Comments: some difficulty pulling water through the straw - recommend cutting straws in half to facilitate effective use.    Nectar Thick Nectar Thick Liquid: Not tested   Honey Thick Honey Thick Liquid: Not tested   Puree Puree: Within functional limits Presentation: Spoon Other Comments: minimal amounts taked with each bolus presented.   Solid     Solid:  (pt refused)     Zoha Spranger B. Murvin Natal, [facility], CCC-SLP Speech Language Pathologist Office: [phone]  Leigh Aurora 08/21/2022,12:47 PM

## 2022-08-21 NOTE — Progress Notes (Signed)
RN notified by tele monitor tech that ST elevated, provider notified, orders received

## 2022-08-21 NOTE — Progress Notes (Addendum)
Physical Therapy Treatment Patient Details Name: Gary Mathis MRN: [id number] DOB: 10-03-1955 Today's Date: 08/21/2022   History of Present Illness Patient is 67 year old gentleman found down at home by sister and presenting with generalized weakness and altered mental status/confusion with some visual hallucinations. Patient noted to have intermittent confusion ongoing for the past 1 to 2 weeks which had worsened over the past 2 to 3 days prior to admission. Patient seen in the ED CT head done negative for any acute abnormalities. CT C-spine negative for any acute abnormalities. Patient admitted with concerns for possible Warnicke's encephalopathy placed on IV thiamine, IV fluids, Ativan withdrawal protocol. PMH: hypertension, hyperlipidemia, low back pain, alcohol abuse.    PT Comments  Pt received in supine, lethargic and sidelying toward his R, clearance per RN and pt's sister (reports she is his main decision maker and previously was assisting him daily at home) for session. Pt A&O to location/self and partially to situation but reluctant to attempt functional mobility tasks and remains lethargic throughout session, tending to keep eyes closed. Pt needing maxA to TotalA +2 for safety and physical assist and unable to attempt standing due to lethargy/L and posterior lean. Pt with poor motor command following this date and repositioned in bed chair posture with lights on/blinds open at end of session to promote improved day/night sleep cycle. Sister present and appreciative of intervention and asking to speak with care team regarding Palliative care options upon DC. Pt with slow progress toward functional mobility goals, noted functional decline and may need goal revision given decline next session by supervising PT.    Assistance Recommended at Discharge Frequent or constant Supervision/Assistance  If plan is discharge home, recommend the following:  Can travel by private vehicle    A lot of  help with bathing/dressing/bathroom;Assistance with cooking/housework;Two people to help with walking and/or transfers;Assistance with feeding;Direct supervision/assist for medications management;Direct supervision/assist for financial management;Assist for transportation;Help with stairs or ramp for entrance;Other (comment) (currently max to totalA +2 for all motor tasks)   No  Equipment Recommendations  Other (comment) (TBD, currently pt at hoyer lift/bed level)    Recommendations for Other Services       Precautions / Restrictions Precautions Precautions: Fall Precaution Comments: CIWA, underlying dementia Restrictions Weight Bearing Restrictions: No     Mobility  Bed Mobility Overal bed mobility: Needs Assistance Bed Mobility: Rolling, Sidelying to Sit, Sit to Sidelying Rolling: Max assist, +2 for safety/equipment Sidelying to sit: +2 for physical assistance, Total assist     Sit to sidelying: Max assist, +2 for safety/equipment General bed mobility comments: Pt with poor attention to task, needs dense multimodal cues and frequently trying to return to supine once sitting EOB. Initially with no righting reactions, some delayed righting reactions after pt sitting EOB 3-4 mins. Consistent L lean throughout while sitting. No c/o dizziness.    Transfers Overall transfer level: Needs assistance                 General transfer comment: Pt refusing to attempt and maintains poor to zero balance while sitting EOB, lethargic; unsafe to attempt today even with +2 assist.    Ambulation/Gait               General Gait Details: pt unable   Stairs             Wheelchair Mobility     Tilt Bed    Modified Rankin (Stroke Patients Only)       Balance Overall  balance assessment: Needs assistance Sitting-balance support: Single extremity supported, No upper extremity supported, Feet supported Sitting balance-Leahy Scale: Zero Sitting balance - Comments: max  to totalA for EOB sitting due to lethargy and cognitive deficit; pt sat EOB >5 mins, needing at least maxA to maintain. PTA sat to his L side while discussing orientation questions to prevent L LOB. Delayed to no righting reactions.                                    Cognition Arousal/Alertness: Lethargic Behavior During Therapy: Impulsive Overall Cognitive Status: Impaired/Different from baseline Area of Impairment: Orientation, Attention, Memory, Following commands, Safety/judgement, Awareness, Problem solving                 Orientation Level: Disoriented to, Time, Situation (not to month/year) Current Attention Level: Focused Memory: Decreased short-term memory Following Commands: Follows one step commands inconsistently Safety/Judgement: Decreased awareness of safety, Decreased awareness of deficits Awareness: Intellectual Problem Solving: Slow processing, Difficulty sequencing, Requires verbal cues, Requires tactile cues, Decreased initiation General Comments: Pt demonstrates persistent lethargy, fatigue, and sleepiness this day. Once EOB, pt maintains eyes mostly closed but able to state "hospital, [facility]" and some facts about his sister Triva who was in room during session. Pt with poor command following throughout and noted functional decline from eval. Family member (states she is POA) asking to speak with MD/Palliative team re: plan of care after DC from hospital, RN/MD notified.        Exercises Other Exercises Other Exercises: pt not following instructions for seated/supine BLE ROM due to lethargy    General Comments General comments (skin integrity, edema, etc.): HR ~90bpm sitting EOB per tele monitor.      Pertinent Vitals/Pain Pain Assessment Pain Assessment: PAINAD Breathing: normal Negative Vocalization: occasional moan/groan, low speech, negative/disapproving quality Facial Expression: smiling or inexpressive Body Language: tense,  distressed pacing, fidgeting Consolability: no need to console PAINAD Score: 2 Pain Intervention(s): Monitored during session, Limited activity within patient's tolerance, Repositioned    Home Living     Available Help at Discharge: Family;Friend(s);Available PRN/intermittently Type of Home: House                  Prior Function            PT Goals (current goals can now be found in the care plan section) Acute Rehab PT Goals Patient Stated Goal: To rest PT Goal Formulation: With patient/family Time For Goal Achievement: 08/31/22 Progress towards PT goals: Not progressing toward goals - comment;PT to reassess next treatment (functional decline, family asking about Palliative services)    Frequency    Min 3X/week      PT Plan Current plan remains appropriate    Co-evaluation              AM-PAC PT "6 Clicks" Mobility   Outcome Measure  Help needed turning from your back to your side while in a flat bed without using bedrails?: A Lot Help needed moving from lying on your back to sitting on the side of a flat bed without using bedrails?: Total Help needed moving to and from a bed to a chair (including a wheelchair)?: Total Help needed standing up from a chair using your arms (e.g., wheelchair or bedside chair)?: Total Help needed to walk in hospital room?: Total Help needed climbing 3-5 steps with a railing? : Total 6 Click Score: 7  End of Session   Activity Tolerance: Patient limited by lethargy Patient left: in bed;with call bell/phone within reach;with bed alarm set;Other (comment);with family/visitor present (bed in chair posture, pt heels floated, folded blanket under his RUE to prevent R lean, sister present in room) Nurse Communication: Mobility status;Other (comment) (pt sister asking to speak with MD/care team regarding palliative approach to his care) PT Visit Diagnosis: Unsteadiness on feet (R26.81);Other abnormalities of gait and mobility  (R26.89);History of falling (Z91.81)     Time: 2130-8657 PT Time Calculation (min) (ACUTE ONLY): 22 min  Charges:    $Therapeutic Activity: 8-22 mins PT General Charges $$ ACUTE PT VISIT: 1 Visit                     Marlin Jarrard P., PTA Acute Rehabilitation Services Secure Chat Preferred 9a-5:30pm Office: [phone]    Dorathy Kinsman [facility] 08/21/2022, 12:06 PM

## 2022-08-21 NOTE — Progress Notes (Signed)
PROGRESS NOTE    Gary Mathis  ZOX:[id number] DOB: 04-May-1955 DOA: 08/16/2022 PCP: Patient, No Pcp Per   Brief Narrative:  Patient is a 67 year old Caucasian male with past medical history significant for but limited to hypertension, hyperlipidemia low back pain as well as alcohol abuse and other comorbidities presented with generalized weakness and altered mental status with confusion along with associated visual hallucinations.  He has been having intermittent confusion last 1 to 2 weeks which is worsened over the last 2 to 3 days prior to admission.  In the ED head CT was done and showed no acute abnormalities and CT cervical spine done and showed no acute abnormalities.  Per report the patient was found on the floor by his sister and he was admitted with concerns for possible Warnicke's encephalopathy and placed on IV thiamine, fluids and Ativan withdrawal protocol.  He continues to hallucinate and workup has been fairly unremarkable.    Continues to not eat and has been hallucinating intermittently.  Remains somnolent and has been mumbling.  Neurology was consulted and recommending outpatient evaluation for cognitive issues and also palliative consultation.  Palliative consulted and goals of care meeting happening tomorrow.  Patient may likely be transition to comfort care pending the goal of care conversations tomorrow.  Assessment and Plan:  Acute Metabolic Encephalopathy/Visual Hallucinations -Patient presented with generalized weakness, ongoing confusion worsened over the past 2 to 3 days and now with some visual hallucinations. -On 08/17/2022 patient noted to have some visual hallucinations stating that he was seen cats and mice playing under his bed.   -Patient denies any further visual hallucinations on 08/18/2022 however persisted patient with some visual hallucinations today 08/19/2022.. -Patient noted to have been found down at home.  -His sister when she went to check up on him on the  day of admission and patient also noted to have some recent weight loss with concerns for poor nutrition. -Patient with history of alcohol use concern presentation may be secondary to alcohol withdrawal in the setting of possible Wernicke's encephalopathy. -Concern that patient may also have an underlying dementia.  Sister concern patient may have possible underlying dementia as she also states prior to admission patient had been having some bouts of sundowning. -Patient with a leukocytosis with no signs of obvious infection which has since resolved. -WBC Trend: Recent Labs  Lab 08/16/22 1144 08/17/22 0038 08/18/22 0851 08/19/22 0708 08/20/22 0101  WBC 15.9* 10.2 8.9 8.6 9.4  -Chest x-ray negative for any acute infiltrates. -Urinalysis nitrite negative, leukocytes negative. -TSH within normal limits, vitamin B12 of 475, ammonia level on admission was 35, -Thiamine level as below. -RPR nonreactive.   -Patient afebrile. -MRI brain negative for any acute abnormalities but did show "There is patchy and confluent FLAIR signal abnormality in the supratentorial white matter likely reflecting sequela of moderate chronic small-vessel ischemic change, advanced for age. 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 -Continued Ativan withdrawal protocol. -Continue Librium detox protocol. -Checking EEG and showed "This study is suggestive of moderate diffuse encephalopathy, nonspecific etiology. No seizures or epileptiform discharges were seen throughout the recording." -If no significant improvement after completion of Librium detox protocol may consider neurology input. -The patient has dementia in the setting of his chronic small vessel ischemic changes and disease as well as history of multiple remote lacunar infarcts in  the basal ganglia and thalami. -Neuro  consulted for futher evaluation; recommended obtaining a cognitive evaluation by SLP and continue PT OT as well as recommending outpatient neurology evaluation for mental evaluation of gradual cognitive and memory decline. -Neurology also recommended palliative care consultation given his cognitive decline and decline in ability to physically care for himself -Palliative care medicine consulted for goals of care discussion was had and patient was changed to DNR/DNI.  Patient's sister is interested in more about discussion about transition to comfort measures and consideration of hospice facility and patient's sister and her other sister will be present tomorrow and like to discuss as a family with the palliative meeting following  Mild Rhabdomyolysis -Patient's CK level went from 1434 -> 239 -IVF now stopped   Alcohol abuse/?  Alcohol withdrawal -Patient stated drinks about a 40 ounce of beer daily. -Last alcohol use was 3 days prior to admission. -Patient presented with confusion, generalized weakness, concern for possible alcohol withdrawal. -Thiamine level was 293.6. -Continued high-dose IV thiamine and is getting 500 mg TID x5 Days and then 250 mg Daily x5 Days -Continued Ativan withdrawal protocol. -Continued Clordiazepoxide 25 mg po Dailu detox protocol.   -Continue folic acid, multivitamin.   -IV fluids now Stopped    Generalized Weakness/Recurrent Falls -Head CT negative for any acute abnormalities. -MRI of brain negative for any acute abnormalities but did show "There is patchy and confluent FLAIR signal abnormality in the supratentorial white matter likely reflecting sequela of moderate chronic small-vessel ischemic change, advanced for age. 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 -PT/OT has assessed patient and recommended SNF placement.   Hyperlipidemia -Continue to hold Statin as patient presented with generalized  weakness and fatigue. -Would likely resume statin on discharge.   Hypertension -Continue Amlodipine 10 mg po Daily.   -Continue to hold ARB.  -Continue to Monitor BP per Protocol -Last BP reading was 137/82   Leukocytosis -Likely reactive leukocytosis and now improved. -Patient afebrile. -WBC Trend: Recent Labs  Lab 08/16/22 1144 08/17/22 0038 08/18/22 0851 08/19/22 0708 08/20/22 0101  WBC 15.9* 10.2 8.9 8.6 9.4  -Chest x-ray negative for any acute infiltrate. -Urinalysis not consistent with UTI. -Leukocytosis has improved.   -Continue to Monitor for S/Sx of Infection -Repeat CBC in the AM   Hypokalemia -Patient's K+ Level Trend: Recent Labs  Lab 08/16/22 1144 08/17/22 0038 08/18/22 0851 08/19/22 0708 08/20/22 0101  K 4.4 3.5 3.2* 3.9 3.9  -Replete with po KCL 40 mEQ BID -Continue to Monitor and Replete as Necessary -Repeat CMP in the AM   Tobacco Abuse -C/w Nicotine Patch 14 mg TD q24h and Nicotine Polacrilex 2 mg poPRN Smoking Cessastion  Severe Protein Calorie Malnutrition Nutrition Status: Nutrition Problem: Severe Malnutrition Etiology: social / environmental circumstances Signs/Symptoms: severe muscle depletion, severe fat depletion Interventions: Ensure Enlive (each supplement provides 350kcal and 20 grams of protein), Refer to RD note for recommendations   Hypoalbuminemia -Patient's Albumin Trend: Recent Labs  Lab 08/16/22 1600 08/17/22 0038 08/18/22 0851 08/20/22 0101  ALBUMIN 4.3 3.4* 3.4* 3.7  -Continue to Monitor and Trend and repeat CMP in the AM   DVT prophylaxis: enoxaparin (LOVENOX) injection 40 mg Start: 08/16/22 2000    Code Status: DNR Family Communication: Discussed with Sister at bedside  Disposition Plan:  Level of care: Telemetry Medical Status is: Inpatient Remains inpatient appropriate because: Needs further GOC discussion   Consultants:  Neuro Palliative Care Medicine  Procedures:  As delineated as  above  Antimicrobials:  Anti-infectives (From admission, onward)    None       Subjective: Seen and examined at bedside and patient's sister states that he has not been eating and patient is been mumbling and wants to be left alone.  Patient's sister thinks that he is "worn out".  Denies any other complaints and does not want to be disturbed.  Objective: Vitals:   08/20/22 1613 08/20/22 2020 08/21/22 0422 08/21/22 0917  BP: (!) 150/81 125/81 (!) 163/95 137/82  Pulse: (!) 102 98 93 86  Resp: 18 18 16 18   Temp: 98 F (36.7 C) (!) 97.4 F (36.3 C) 98.3 F (36.8 C) 97.6 F (36.4 C)  TempSrc:  Oral Oral Oral  SpO2: 98% 98% 99% 97%  Weight:      Height:        Intake/Output Summary (Last 24 hours) at 08/21/2022 1821 Last data filed at 08/21/2022 1300 [place] per 24 hour  Intake 180 ml  Output 1400 ml  Net -1220 ml   Filed Weights   08/16/22 1150 08/16/22 1731  Weight: 49.9 kg 60.5 kg   Examination: Physical Exam:  Constitutional: Thin Caucasian male in no acute distress Respiratory: Diminished to auscultation bilaterally, no wheezing, rales, rhonchi or crackles. Normal respiratory effort and patient is not tachypenic. No accessory muscle use.  Unlabored breathing Cardiovascular: RRR, no murmurs / rubs / gallops. S1 and S2 auscultated. No extremity edema.  Abdomen: Soft, non-tender, non-distended. Bowel sounds positive.  GU: Deferred. Musculoskeletal: No clubbing / cyanosis of digits/nails. No joint deformity upper and lower extremities.  Skin: No rashes, lesions, ulcers on limited skin evaluation. No induration; Warm and dry.  Neurologic: Is little somnolent and drowsy and mumbles.  Was very agitated yesterday Psychiatric: Impaired judgment and insight.  Data Reviewed: I have personally reviewed following labs and imaging studies  CBC: Recent Labs  Lab 08/16/22 1144 08/17/22 0038 08/18/22 0851 08/19/22 0708 08/20/22 0101  WBC 15.9* 10.2 8.9 8.6 9.4  HGB 14.4  12.8* 13.4 15.2 15.3  HCT 42.0 38.1* 39.9 44.9 44.3  MCV 93.1 94.1 95.9 93.5 93.7  PLT 312 268 266 280 289   Basic Metabolic Panel: Recent Labs  Lab 08/16/22 1144 08/16/22 1600 08/17/22 0038 08/18/22 0851 08/19/22 0708 08/20/22 0101  NA 131*  --  134* 139 137 137  K 4.4  --  3.5 3.2* 3.9 3.9  CL 97*  --  103 104 102 104  CO2 20*  --  25 27 23 22   GLUCOSE 100*  --  96 95 89 108*  BUN 24*  --  16 9 9 14   CREATININE 1.12  --  1.05 0.83 0.79 0.93  CALCIUM 9.4  --  8.7* 8.6* 9.3 9.3  MG  --  2.2  --  1.9  --  2.1  PHOS  --  3.6  --  2.7  --  4.3   GFR: Estimated Creatinine Clearance: 66.9 mL/min (by C-G formula based on SCr of 0.93 mg/dL). Liver Function Tests: Recent Labs  Lab 08/16/22 1600 08/17/22 0038 08/18/22 0851 08/20/22 0101  AST 47* 50*  --  27  ALT 35 29  --  24  ALKPHOS 63 52  --  60  BILITOT 0.9 0.8  --  0.8  PROT 7.1 5.8*  --  6.6  ALBUMIN 4.3 3.4* 3.4* 3.7   No results for input(s): "LIPASE", "AMYLASE" in the last 168 hours. Recent Labs  Lab  08/16/22 1453  AMMONIA 35   Coagulation Profile: No results for input(s): "INR", "PROTIME" in the last 168 hours. Cardiac Enzymes: Recent Labs  Lab 08/16/22 1600 08/19/22 0708 08/26/22 0101  CKTOTAL 1,434* 419* 239   BNP (last 3 results) No results for input(s): "PROBNP" in the last 8760 hours. HbA1C: No results for input(s): "HGBA1C" in the last 72 hours. CBG: Recent Labs  Lab 08/16/22 1149  GLUCAP 99   Lipid Profile: No results for input(s): "CHOL", "HDL", "LDLCALC", "TRIG", "CHOLHDL", "LDLDIRECT" in the last 72 hours. Thyroid Function Tests: No results for input(s): "TSH", "T4TOTAL", "FREET4", "T3FREE", "THYROIDAB" in the last 72 hours. Anemia Panel: No results for input(s): "VITAMINB12", "FOLATE", "FERRITIN", "TIBC", "IRON", "RETICCTPCT" in the last 72 hours. Sepsis Labs: No results for input(s): "PROCALCITON", "LATICACIDVEN" in the last 168 hours.  No results found for this or any previous  visit (from the past 240 hour(s)).   Radiology Studies: EEG adult  Result Date: 08-26-22 Charlsie Quest, MD     26-Aug-2022  9:24 PM Patient Name: VISHNU MOELLER MRN: [id number] Epilepsy Attending: Charlsie Quest Referring Physician/Provider: Merlene Laughter, DO Date: August 26, 2022 Duration: 22.43 mins Patient history: 67yo m with ams getting eeg to evaluate for seizure Level of alertness: Awake AEDs during EEG study: Chlordiazepoxide Technical aspects: This EEG study was done with scalp electrodes positioned according to the 10-20 International system of electrode placement. Electrical activity was reviewed with band pass filter of 1-70Hz , sensitivity of 7 uV/mm, display speed of 64mm/sec with a 60Hz  notched filter applied as appropriate. EEG data were recorded continuously and digitally stored.  Video monitoring was available and reviewed as appropriate. Description: EEG showed continuous generalized 3 to 6 Hz theta-delta slowing admixed with 13-15Hz  generalized beta activity. Hyperventilation and photic stimulation were not performed.   ABNORMALITY - Continuous slow, generalized IMPRESSION: This study is suggestive of moderate diffuse encephalopathy, nonspecific etiology. No seizures or epileptiform discharges were seen throughout the recording. Priyanka Annabelle Harman    Scheduled Meds:  amLODipine  10 mg Oral Daily   aspirin EC  81 mg Oral Daily   enoxaparin (LOVENOX) injection  40 mg Subcutaneous Q24H   feeding supplement  237 mL Oral BID BM   folic acid  1 mg Oral Daily   multivitamin with minerals  1 tablet Oral Daily   nicotine  14 mg Transdermal Q0600   pantoprazole  40 mg Oral Q0600   sodium chloride flush  3 mL Intravenous Q12H   [START ON 08/27/2022] thiamine  100 mg Oral Daily   Continuous Infusions:  sodium chloride 10 mL/hr at 08/16/22 2220   [START ON 08/22/2022] thiamine (VITAMIN B1) injection     thiamine (VITAMIN B1) injection 500 mg (08/21/22 1728)    LOS: 4 days   Marguerita Merles, DO [facility] Available via Epic secure chat 7am-7pm After these hours, please refer to coverage provider listed on [email or web address] 08/21/2022, 6:21 PM

## 2022-08-21 NOTE — Progress Notes (Signed)
Pt given ice cube and held in his mouth and had to be told to swallow. Pt unable to drink through the straw but swallowed well and strong when given thin liquid via spoon

## 2022-08-21 NOTE — Consult Note (Signed)
Consultation Note Date: 08/21/2022   Patient Name: Gary Mathis  DOB: 11-23-1955  MRN: [id number]  Age / Sex: 67 y.o., male  PCP: Patient, No Pcp Per Referring Physician: Merlene Laughter, DO  Reason for Consultation: Establishing goals of care  HPI/Patient Profile: 67 y.o. male  with past medical history of hypertension, hyperlipidemia, low back pain, and EtOH abuse admitted on 08/16/2022 with AMS and visual hallucinations.  CT head negative.  Concern for Wernicke's encephalopathy. PMT consulted to discuss GOC.  Clinical Assessment and Goals of Care: I have reviewed medical records including EPIC notes, labs and imaging, assessed the patient and then met with patient's sister Gary Mathis  to discuss diagnosis prognosis, GOC, EOL wishes, disposition and options.  I introduced Palliative Medicine as specialized medical care for people living with serious illness. It focuses on providing relief from the symptoms and stress of a serious illness. The goal is to improve quality of life for both the patient and the family.  We discussed a brief life review of the patient. Patient has 3 sisters - his sister Gary Mathis has served as caregiver and is Product manager. Patient recently retired. 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   Gary Mathis shares patient has been declining for the past 2 years, last 6 months have been more accelerated decline with the last months being especially rapid.  She tells of frequent falling and worsening cognition.  He has become incredibly weak.  He is unable to figure out how to use the phone or the remote control.  She tells a very poor appetite and weight loss -"skin and bones".   We discussed patient's current illness and what it means in the larger context of patient's on-going co-morbidities.  Natural disease trajectory and expectations at EOL were discussed.  Treatment shares she has watched him  decline.  She recognizes that he is not getting better.  She also recognizes that patient was frail prior to hospitalization and he has had no p.o. intake in 5 days and is unlikely to bounce back from this to previous baseline.  I attempted to elicit values and goals of care important to the patient.    Gary Mathis tells me the patient is "done" and "worn out".  She tells me he wants to be at peace.  She tells me he has made statements to her during this hospitalization such as "I want to die" and "let me go".  The difference between aggressive medical intervention and comfort care was considered in light of the patient's goals of care.  Treated is ready to transition and focus on patient's comfort.  She feels this would align with his goals as well.  We discussed CODE STATUS and Gary Mathis readily agrees to DNR/DNI.  She tells me siblings support this as well.  They know he would not fare well with such aggressive medical care and his frail condition.  Discussed with Gary Mathis the importance of continued conversation with family and the medical providers regarding overall plan of care and treatment options, ensuring decisions are within the context of the patient's values and GOCs.    We discussed different options for care moving forward: transitioning to comfort measures while hospitalized, hospice care at hospice facility, hospice care at home. Gary Mathis seems most interested in hospice facility however she does express that she would like to wait until her sister can be present tomorrow to make formal decisions.   "Hard Choices" booklet and "gone from my  sight" booklet provided to [facility].  Signed DNR placed on chart.   Questions and concerns were addressed. The family was encouraged to call with questions or concerns.    Primary Decision Maker HCPOA - sister - Gary Mathis    SUMMARY OF RECOMMENDATIONS   - code status changed to DNR/DNI - sister is interested in more discussion about transition to comfort  measures and consideration of hospice facility - her sister will be present tomorrow and she would like these discussions to  happen with her present, PMT will follow up  Code Status/Advance Care Planning: DNR      Primary Diagnoses: Present on Admission:  Mixed hyperlipidemia  Essential hypertension  Acute encephalopathy   I have reviewed the medical record, interviewed the patient and family, and examined the patient. The following aspects are pertinent.  No past medical history on file. Social History   Socioeconomic History   Marital status: Single    Spouse name: Not on file   Number of children: Not on file   Years of education: Not on file   Highest education level: Not on file  Occupational History   Not on file  Tobacco Use   Smoking status: Every Day    Current packs/day: 2.00    Average packs/day: 2.0 packs/day for 20.0 years (40.0 ttl pk-yrs)    Types: Cigarettes   Smokeless tobacco: Not on file  Substance and Sexual Activity   Alcohol use: No   Drug use: No   Sexual activity: Not on file  Other Topics Concern   Not on file  Social History Narrative   ** Merged History Encounter **       Social Determinants of Health   Financial Resource Strain: Low Risk  (04/03/2021)   [facility] visits prior to 04/12/2022.   Overall Financial Resource Strain (CARDIA)    Difficulty of Paying Living Expenses: Not very hard  Food Insecurity: Food Insecurity Present (08/16/2022)   Hunger Vital Sign    Worried About Running Out of Food in the Last Year: Sometimes true    Ran Out of Food in the Last Year: Sometimes true  Transportation Needs: No Transportation Needs (08/16/2022)   PRAPARE - Administrator, Civil Service (Medical): No    Lack of Transportation (Non-Medical): No  Physical Activity: Sufficiently Active (04/03/2021)   [facility] to [facility] visits prior to 04/12/2022.   Exercise Vital Sign    Days of Exercise per Week: 3 days    Minutes of Exercise per Session: 50 min  Stress: No Stress Concern Present (04/03/2021)   [facility] from [facility] visits prior to 04/12/2022., [facility] visits prior to 04/12/2022.   [employer] of Occupational Health - Occupational Stress Questionnaire    Feeling of Stress : Only a little  Social Connections: Socially Isolated (04/03/2021)   [facility] from [facility] visits prior to 04/12/2022., [facility] visits prior to 04/12/2022.   Social Connection and Isolation Panel [NHANES]    Frequency of Communication with Friends and Family: Three times a week    Frequency of Social Gatherings with Friends and Family: Three times a week    Attends Religious Services: Never    Active Member of Clubs or  Organizations: No    Attends Engineer, structural: Not on file    Marital Status: Never married   Family History  Problem Relation Age of Onset   Diabetes Mother    Diabetes Father    Cancer Sister    Scheduled Meds:  amLODipine  10 mg Oral Daily   aspirin EC  81 mg Oral Daily   enoxaparin (LOVENOX) injection  40 mg Subcutaneous Q24H   feeding supplement  237 mL Oral BID BM   folic acid  1 mg Oral Daily   multivitamin with minerals  1 tablet Oral Daily   nicotine  14 mg Transdermal Q0600   pantoprazole  40 mg Oral Q0600   sodium chloride flush  3 mL Intravenous Q12H   [START ON 08/27/2022] thiamine  100 mg Oral Daily   Continuous Infusions:  sodium chloride 10 mL/hr at 08/16/22 2220   [START ON 08/22/2022] thiamine (VITAMIN B1) injection     thiamine (VITAMIN B1) injection 500 mg (08/21/22 1033)   PRN Meds:.sodium chloride, acetaminophen **OR** acetaminophen, haloperidol  **OR** haloperidol lactate, nicotine polacrilex, polyethylene glycol No Known Allergies Review of Systems  Unable to perform ROS: Patient nonverbal    Physical Exam Constitutional:      General: He is not in acute distress.    Appearance: He is ill-appearing.     Comments: lethargic  Pulmonary:     Effort: Pulmonary effort is normal.  Skin:    General: Skin is warm and dry.  Neurological:     Mental Status: He is disoriented.     Vital Signs: BP 137/82 (BP Location: Right Arm)   Pulse 86   Temp 97.6 F (36.4 C) (Oral)   Resp 18   Ht 5\' 11"  (1.803 m)   Wt 60.5 kg   SpO2 97%   BMI 18.60 kg/m  Pain Scale: 0-10   Pain Score: Asleep   SpO2: SpO2: 97 % O2 Device:SpO2: 97 % O2 Flow Rate: .   IO: Intake/output summary:  Intake/Output Summary (Last 24 hours) at 08/21/2022 1435 Last data filed at 08/21/2022 1300 Bowne per 24 hour  Intake 420 ml  Output 1600 ml  Net -1180 ml    LBM: Last BM Date : 08/19/22 Baseline Weight: Weight: 49.9 kg Most recent weight: Weight: 60.5 kg     Palliative Assessment/Data: PPS 10% - no PO intake     *Please note that this is a verbal dictation therefore any spelling or grammatical errors are due to the "Dragon Medical One" system interpretation.  Gerlean Ren, DNP, AGNP-C Palliative Medicine Team [phone] Pager: [phone]

## 2022-08-21 NOTE — Evaluation (Signed)
Speech Language Pathology Evaluation Patient Details Name: Gary Mathis MRN: [id number] DOB: 07-Apr-1955 Today's Date: 08/21/2022 Time: 1210-1230 SLP Time Calculation (min) (ACUTE ONLY): 20 min  Problem List:  Patient Active Problem List   Diagnosis Date Noted   Protein-calorie malnutrition, severe 08/19/2022   Generalized weakness 08/17/2022   Alcohol abuse 08/17/2022   Hallucination, visual 08/17/2022   Acute encephalopathy 08/16/2022   Leukocytosis 08/16/2022   History of alcohol dependence (HCC) 04/23/2022   Pneumothorax 04/09/2020   Prediabetes 12/30/2018   Mixed hyperlipidemia 12/30/2018   Essential hypertension 12/30/2018   Chronic bilateral low back pain with right-sided sciatica 12/17/2018   Past Medical History: No past medical history on file. Past Surgical History:  Past Surgical History:  Procedure Laterality Date   ANKLE FRACTURE SURGERY     metal plate with 9 scews   HPI:  67yo male admitted 08/16/22 after a fall at home, with encephalopathy, AMS, weakness. PMH: recurrent falls, HTN, HLD, low back pain, alcohol/marijuana/tobacco use, visual hallucinations. CXR negative. MRI = no acute intracranial pathology.   Assessment / Plan / Recommendation Clinical Impression  Limited cognitive linguistic evaluation completed this date. Pt required ongoing encouragement to participate in assessment, with frequent responses being "no", "I don't want to", "Why are you doing this?". Pt was awake, but kept eyes closed throughout session. Pt was unable or unwilling to answer some questions - name, DOB, naming tasks, but was able to verbalize current year (correctly) and month (incorrectly, stating October). Unable to fully complete the Mini-Mental State Examination (MMSE) due to variable willingness to cooperate. Based on today's presentation at bedside, recommend 24 hour supervision/assistance at DC.   Also recommend Palliative Care consult to facilitate appropriate goals of care.  Pt's sister reports "he's done. He just wants peace." MD and RN aware.    SLP Assessment  SLP Recommendation/Assessment: Patient does not need any further Speech Language Pathology Services  SLP Visit Diagnosis: Cognitive communication deficit (R41.841)    Recommendations for follow up therapy are one component of a multi-disciplinary discharge planning process, led by the attending physician.  Recommendations may be updated based on patient status, additional functional criteria and insurance authorization.    Follow Up Recommendations  No SLP follow up    Assistance Recommended at Discharge   24 hour supervision/assistance  Functional Status Assessment Patient has not had a recent decline in their functional status - sister reports decline recently     SLP Evaluation Cognition  Overall Cognitive Status: History of cognitive impairments - at baseline Arousal/Alertness: Awake/alert (kept eyes closed throughout evaluation) Orientation Level: Oriented to person;Oriented to place Year: 2024 Month: October Day of Week: Incorrect       Comprehension  Auditory Comprehension Overall Auditory Comprehension: Appears within functional limits for tasks assessed Conversation: Simple    Expression Expression Primary Mode of Expression: Verbal Verbal Expression Overall Verbal Expression: Appears within functional limits for tasks assessed Written Expression Dominant Hand: Left   Oral / Motor  Oral Motor/Sensory Function Overall Oral Motor/Sensory Function: Generalized oral weakness Motor Speech Overall Motor Speech: Appears within functional limits for tasks assessed Articulation: Impaired Level of Impairment: Sentence Intelligibility: Intelligibility reduced (due to edentulousness, rapid rate of speech, and low volume) Conversation: 50-74% accurate           Nicolae Vasek B. Murvin Natal, [facility], CCC-SLP Speech Language Pathologist Office: [phone]  Leigh Aurora 08/21/2022, 1:01  PM

## 2022-08-22 DIAGNOSIS — F1010 Alcohol abuse, uncomplicated: Secondary | ICD-10-CM | POA: Diagnosis not present

## 2022-08-22 DIAGNOSIS — Z515 Encounter for palliative care: Secondary | ICD-10-CM | POA: Diagnosis not present

## 2022-08-22 DIAGNOSIS — Z7189 Other specified counseling: Secondary | ICD-10-CM | POA: Diagnosis not present

## 2022-08-22 DIAGNOSIS — G9340 Encephalopathy, unspecified: Secondary | ICD-10-CM | POA: Diagnosis not present

## 2022-08-22 DIAGNOSIS — R531 Weakness: Secondary | ICD-10-CM | POA: Diagnosis not present

## 2022-08-22 LAB — FOLATE: Folate: 17.7 ng/mL (ref >5.9)

## 2022-08-22 MED ORDER — GLYCOPYRROLATE 0.2 MG/ML IJ SOLN: 0.2000 mg | INTRAMUSCULAR | Status: DC | PRN

## 2022-08-22 MED ORDER — ONDANSETRON HCL 4 MG/2ML IJ SOLN: 4.0000 mg | Freq: Four times a day (QID) | INTRAMUSCULAR | Status: DC | PRN

## 2022-08-22 MED ORDER — MORPHINE SULFATE 10 MG/5ML PO SOLN: 5.0000 mg | ORAL | Status: DC | PRN

## 2022-08-22 MED ORDER — MORPHINE SULFATE (PF) 2 MG/ML IV SOLN
1.0000 mg | INTRAVENOUS | Status: DC | PRN
  Administered 2022-08-22: 1 mg via INTRAVENOUS
  Filled 2022-08-22: qty 1

## 2022-08-22 MED ORDER — MORPHINE SULFATE (PF) 2 MG/ML IV SOLN: 2.0000 mg | Freq: Two times a day (BID) | INTRAVENOUS | Status: DC

## 2022-08-22 MED ORDER — GLYCOPYRROLATE 1 MG PO TABS: 1.0000 mg | ORAL_TABLET | ORAL | Status: DC | PRN

## 2022-08-22 MED ORDER — POLYVINYL ALCOHOL 1.4 % OP SOLN: 1.0000 [drp] | Freq: Four times a day (QID) | OPHTHALMIC | Status: DC | PRN

## 2022-08-22 MED ORDER — LORAZEPAM 2 MG/ML IJ SOLN: 0.5000 mg | INTRAMUSCULAR | Status: DC | PRN

## 2022-08-22 MED ORDER — ACETAMINOPHEN 325 MG PO TABS: 650.0000 mg | ORAL_TABLET | Freq: Four times a day (QID) | ORAL | Status: DC | PRN

## 2022-08-22 MED ORDER — BIOTENE DRY MOUTH MT LIQD: 15.0000 mL | OROMUCOSAL | Status: DC | PRN

## 2022-08-22 MED ORDER — ONDANSETRON 4 MG PO TBDP: 4.0000 mg | ORAL_TABLET | Freq: Four times a day (QID) | ORAL | Status: DC | PRN

## 2022-08-22 MED ORDER — LORAZEPAM 2 MG/ML IJ SOLN
0.5000 mg | Freq: Four times a day (QID) | INTRAMUSCULAR | Status: DC
  Administered 2022-08-22: 0.5 mg via INTRAVENOUS
  Filled 2022-08-22: qty 1

## 2022-08-22 NOTE — Progress Notes (Signed)
Civil engineer, contracting [facility]) Hospital Liaison Note  Referral received for patient/family interest in [facility]. Chart under review by [facility] physician.   Bed offered and accepted for transport today. Unit RN please call report to [phone] prior to patient leaving the unit. Please send signed DNR and paperwork with patient.   Please leave all IV access and ports in place.   Please call with any questions or concerns. Thank you  Dionicio Stall, [facility] Liaison [phone]
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
## 2022-08-22 NOTE — Plan of Care (Signed)
  Problem: Education: Goal: Knowledge of General Education information will improve Description: Including pain rating scale, medication(s)/side effects and non-pharmacologic comfort measures Outcome: Not Applicable   Problem: Health Behavior/Discharge Planning: Goal: Ability to manage health-related needs will improve Outcome: Not Met (add Reason)   Problem: Clinical Measurements: Goal: Ability to maintain clinical measurements within normal limits will improve Outcome: Adequate for Discharge Goal: Will remain free from infection Outcome: Completed/Met Goal: Diagnostic test results will improve Outcome: Not Applicable Goal: Respiratory complications will improve Outcome: Adequate for Discharge Goal: Cardiovascular complication will be avoided Outcome: Adequate for Discharge   Problem: Activity: Goal: Risk for activity intolerance will decrease Outcome: Not Applicable   Problem: Nutrition: Goal: Adequate nutrition will be maintained Outcome: Not Applicable   Problem: Coping: Goal: Level of anxiety will decrease Outcome: Completed/Met   Problem: Elimination: Goal: Will not experience complications related to bowel motility Outcome: Completed/Met Goal: Will not experience complications related to urinary retention Outcome: Completed/Met   Problem: Pain Managment: Goal: General experience of comfort will improve Outcome: Completed/Met   Problem: Safety: Goal: Ability to remain free from injury will improve Outcome: Completed/Met   Problem: Skin Integrity: Goal: Risk for impaired skin integrity will decrease Outcome: Completed/Met   Problem: Education: Goal: Knowledge of the prescribed therapeutic regimen will improve Outcome: Completed/Met   Problem: Coping: Goal: Ability to identify and develop effective coping behavior will improve Outcome: Completed/Met   Problem: Clinical Measurements: Goal: Quality of life will improve Outcome: Not Applicable    Problem: Respiratory: Goal: Verbalizations of increased ease of respirations will increase Outcome: Adequate for Discharge   Problem: Role Relationship: Goal: Family's ability to cope with current situation will improve Outcome: Not Met (add Reason) Goal: Ability to verbalize concerns, feelings, and thoughts to partner or family member will improve Outcome: Not Applicable   Problem: Pain Management: Goal: Satisfaction with pain management regimen will improve Outcome: Completed/Met   Pt being converted to comfort care. Family was at bedside and no needs voiced. Pt sleeping and not agitated at present

## 2022-08-22 NOTE — Final Progress Note (Signed)
DISCHARGE NOTE HOME Damareon Woodliff Bertran to be discharged  [facility] for Hospice Care  per MD order. Discussed prescriptions with Elijah Birk at [facility].   Skin clean, dry and intact without evidence of skin break down, no evidence of skin tears noted. IV catheter left in Right forearm per [facility] request. Condom catheter also left in place per facility request.    An After Visit Summary (AVS) was printed and given to PT[facility] along with yellow DNR. HCPOA and family aware of transfer to [facility].  Irwin Brakeman, RN

## 2022-08-22 NOTE — TOC Transition Note (Signed)
Transition of Care [facility]) - CM/SW Discharge Note   Patient Details  Name: Gary Mathis MRN: [id number] Date of Birth: 12/29/1955  Transition of Care [facility]) CM/SW Contact:  Ralene Bathe, LCSW Phone Number: 08/22/2022, 2:57 PM   Clinical Narrative:    Patient will DC to: [facility] Anticipated DC date: 08/22/2022 Transport by: Sharin Mons   Per MD patient ready for DC to hospice facility. RN to call report prior to discharge [phone]). RN, patient's family, and facility notified of DC. Ambulance transport will be requested for patient.   CSW will sign off for now as social work intervention is no longer needed. Please consult [place] again if new needs arise.   Final next level of care: Hospice Medical Facility Barriers to Discharge: Barriers Resolved   Patient Goals and CMS Choice CMS Medicare.gov Compare Post Acute Care list provided to:: Patient Represenative (must comment) Choice offered to / list presented to : Sibling  Discharge Placement                Patient chooses bed at:  [facility]) Patient to be transferred to facility by: PTAR Name of family member notified: sister, Triva Patient and family notified of of transfer: 08/22/22  Discharge Plan and Services Additional resources added to the After Visit Summary for   In-house Referral: Clinical Social Work                                   Social Determinants of Health (SDOH) Interventions SDOH Screenings   Food Insecurity: Food Insecurity Present (08/16/2022)  Housing: Low Risk  (08/16/2022)  Transportation Needs: No Transportation Needs (08/16/2022)  Utilities: Not At Risk (08/16/2022)  Financial Resource Strain: Low Risk  (04/03/2021)   [facility] to 04/12/2022[facility] visits prior to 04/12/2022., [facility]  Physical Activity: Sufficiently Active (04/03/2021)   [facility]  visits prior to 04/12/2022., [facility] visits prior to 04/12/2022., [facility]  Social Connections: Socially Isolated (04/03/2021)   [facility] visits prior to 04/12/2022., [facility] visits prior to 04/12/2022., [facility]  Stress: No Stress Concern Present (04/03/2021)   [facility] prior to 04/12/2022., [facility] visits prior to 04/12/2022., [facility]  Tobacco Use: High Risk (04/23/2022)   [facility]     Readmission Risk Interventions     No data to display

## 2022-08-22 NOTE — Discharge Summary (Signed)
Physician Discharge Summary   Patient: Gary Mathis MRN: [id number] DOB: [birth year]  Admit date:     08/16/2022  Discharge date: 08/22/22  Discharge Physician: Marguerita Merles, DO   PCP: Patient, No Pcp Per   Recommendations at discharge:    Further Care per Hospice Protocol  Discharge Diagnoses: Principal Problem:   Acute encephalopathy Active Problems:   Mixed hyperlipidemia   Essential hypertension   Leukocytosis   Generalized weakness   Alcohol abuse   Hallucination, visual   Protein-calorie malnutrition, severe   Hospice care patient  Resolved Problems:   * No resolved hospital problems. *  Hospital Course: Patient is a 67 year old Caucasian male with past medical history significant for but limited to hypertension, hyperlipidemia low back pain as well as alcohol abuse and other comorbidities presented with generalized weakness and altered mental status with confusion along with associated visual hallucinations.  He has been having intermittent confusion last 1 to 2 weeks which is worsened over the last 2 to 3 days prior to admission.  In the ED head CT was done and showed no acute abnormalities and CT cervical spine done and showed no acute abnormalities.  Per report the patient was found on the floor by his sister and he was admitted with concerns for possible Warnicke's encephalopathy and placed on IV thiamine, fluids and Ativan withdrawal protocol.  He continues to hallucinate and workup has been fairly unremarkable.    Continues to not eat and has been hallucinating intermittently.  Remains somnolent and has been mumbling.  Neurology was consulted and recommending outpatient evaluation for cognitive issues and also palliative consultation.  Palliative consulted and goals of care meeting happening tomorrow.  Patient may likely be transition to comfort care pending the goal of care conversations tomorrow.  Assessment and Plan:  Acute Metabolic Encephalopathy/Visual  Hallucinations -Patient presented with generalized weakness, ongoing confusion worsened over the past 2 to 3 days and now with some visual hallucinations. -On 08/17/2022 patient noted to have some visual hallucinations stating that he was seen cats and mice playing under his bed.   -Patient denies any further visual hallucinations on 08/18/2022 however persisted patient with some visual hallucinations today 08/19/2022.. -Patient noted to have been found down at home.  -His sister when she went to check up on him on the day of admission and patient also noted to have some recent weight loss with concerns for poor nutrition. -Patient with history of alcohol use concern presentation may be secondary to alcohol withdrawal in the setting of possible Wernicke's encephalopathy. -Concern that patient may also have an underlying dementia.  Sister concern patient may have possible underlying dementia as she also states prior to admission patient had been having some bouts of sundowning. -Patient with a leukocytosis with no signs of obvious infection which has since resolved. -WBC Trend: Recent Labs  Lab 08/16/22 1144 08/17/22 0038 08/18/22 0851 08/19/22 0708 08/20/22 0101  WBC 15.9* 10.2 8.9 8.6 9.4  -Chest x-ray negative for any acute infiltrates. -Urinalysis nitrite negative, leukocytes negative. -TSH within normal limits, vitamin B12 of 475, ammonia level on admission was 35, -Thiamine level as below. -RPR nonreactive.   -Patient afebrile. -MRI brain negative for any acute abnormalities but did show "There is patchy and confluent FLAIR signal abnormality in the supratentorial white matter likely reflecting sequela of moderate chronic small-vessel ischemic change, advanced for age. 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 -Continued Ativan withdrawal  protocol. -Continue Librium detox protocol. -Checking EEG and showed "This study is suggestive of moderate diffuse encephalopathy, nonspecific etiology. No seizures or epileptiform discharges were seen throughout the recording." -If no significant improvement after completion of Librium detox protocol may consider neurology input. 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 -Neuro consulted for futher evaluation; recommended obtaining a cognitive evaluation by SLP and continue PT OT as well as recommending outpatient neurology evaluation for mental evaluation of gradual cognitive and memory decline. -Neurology also recommended palliative care consultation given his cognitive decline and decline in ability to physically care for himself -Palliative care medicine consulted for goals of care discussion was had and patient was changed to DNR/DNI.  Patient's sister is interested in more about discussion about transition to comfort measures and consideration of hospice facility and patient's sisters met with Palliative Care and patient is being transitioned to Comfort Care  Mild Rhabdomyolysis -Patient's CK level went from 1434 -> 239 -IVF now stopped   Alcohol abuse/?  Alcohol withdrawal -Patient stated drinks about a 40 ounce of beer daily. -Last alcohol use was 3 days prior to admission. -Patient presented with confusion, generalized weakness, concern for possible alcohol withdrawal. -Thiamine level was 293.6. -Continued high-dose IV thiamine and is getting 500 mg TID x5 Days and then 250 mg Daily x5 Days -Continued Ativan withdrawal protocol. -Continued Clordiazepoxide 25 mg po Dailu detox protocol.   -Continue folic acid, multivitamin.   -IV fluids now Stopped    Generalized Weakness/Recurrent Falls -Head CT negative for any acute abnormalities. -MRI  of brain negative for any acute abnormalities but did show "There is patchy and confluent FLAIR signal abnormality in the supratentorial white matter likely reflecting sequela of moderate chronic small-vessel ischemic change, advanced for age. 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 -PT/OT has assessed patient and recommended SNF placement.   Hyperlipidemia -Continue to hold Statin as patient presented with generalized weakness and fatigue. -Would likely resume statin on discharge.   Hypertension -Continue Amlodipine 10 mg po Daily.   -Continue to hold ARB.  -Continue to Monitor BP per Protocol -Last BP reading was 116/76   Leukocytosis -Likely reactive leukocytosis and now improved. -Patient afebrile. -WBC Trend: Recent Labs  Lab 08/16/22 1144 08/17/22 0038 08/18/22 0851 08/19/22 0708 08/20/22 0101  WBC 15.9* 10.2 8.9 8.6 9.4  -Chest x-ray negative for any acute infiltrate. -Urinalysis not consistent with UTI. -Leukocytosis has improved.   -Continue to Monitor for S/Sx of Infection -Will not Repeat CBC in the AM   Hypokalemia -Patient's K+ Level Trend: Recent Labs  Lab 08/16/22 1144 08/17/22 0038 08/18/22 0851 08/19/22 0708 08/20/22 0101  K 4.4 3.5 3.2* 3.9 3.9  -Replete with po KCL 40 mEQ BID -Continue to Monitor and Replete as Necessary -Will not Repeat CMP in the AM   Tobacco Abuse -C/w Nicotine Patch 14 mg TD q24h and Nicotine Polacrilex 2 mg poPRN Smoking Cessastion  Severe Protein Calorie Malnutrition Nutrition Status: Nutrition Problem: Severe Malnutrition Etiology: social / environmental circumstances Signs/Symptoms: severe muscle depletion, severe fat depletion Interventions: Ensure Enlive (each supplement provides 350kcal and 20 grams of protein), Refer to RD note for recommendations   Hypoalbuminemia -Patient's Albumin Trend: Recent Labs  Lab 08/16/22 1600 08/17/22 0038  08/18/22 0851 08/20/22 0101  ALBUMIN 4.3 3.4* 3.4* 3.7  -Will not Continue to Monitor and Trend and repeat CMP in the AM  **After further goals of care discussion today the patient family decided to transition the patient to comfort care.  All medications not in the patient's comfort have been discontinued and patient was started on low-dose Ativan and morphine around-the-clock for agitation and chronic back pain.  Plan is not to transition the patient to residential hospice to be given place and he is medically stable for discharge at this time.  Nutrition Documentation    Flowsheet Row ED to Hosp-Admission (Current) from 08/16/2022 in [facility] 55M [facility]  Nutrition Problem Severe Malnutrition  Etiology social / environmental circumstances  Nutrition Goal Patient will meet greater than or equal to 90% of their needs  Interventions Ensure Enlive (each supplement provides 350kcal and 20 grams of protein), Refer to RD note for recommendations      Consultants: Neurology; Palliative Care Medicine  Procedures performed: As delineated as above  Disposition: Hospice care Diet recommendation:  Regular diet DISCHARGE MEDICATION: Allergies as of 08/22/2022   No Known Allergies      Medication List     STOP taking these medications    amLODipine 10 MG tablet Commonly known as: NORVASC   CALCIUM MAGNESIUM ZINC PO   multivitamin tablet   rosuvastatin 10 MG tablet Commonly known as: CRESTOR   telmisartan 20 MG tablet Commonly known as: MICARDIS   VITAMIN D-3 PO       TAKE these medications    acetaminophen 325 MG tablet Commonly known as: TYLENOL Take 2 tablets (650 mg total) by mouth every 6 (six) hours as needed for mild pain (or Fever >/= 101). What changed:  medication strength how much to take when to take this reasons to take this   glycopyrrolate 1 MG tablet Commonly known as: ROBINUL Take 1 tablet (1 mg total) by mouth every 4 (four) hours as  needed (excessive secretions).   LORazepam 2 MG/ML injection Commonly known as: ATIVAN Inject 0.25-0.5 mLs (0.5-1 mg total) into the vein every hour as needed for anxiety, seizure or sedation.   morphine 10 MG/55ML solution Take 2.5 mLs (5 mg total) by mouth every hour as needed (Pain/Dyspnea).   ondansetron 4 MG disintegrating tablet Commonly known as: ZOFRAN-ODT Take 1 tablet (4 mg total) by mouth every 6 (six) hours as needed for nausea.   polyvinyl alcohol 1.4 % ophthalmic solution Commonly known as: LIQUIFILM TEARS Place 1 drop into both eyes 4 (four) times daily as needed for dry eyes.       Discharge Exam: Filed Weights   08/16/22 1150 08/16/22 1731  Weight: 49.9 kg 60.5 kg   Vitals:   08/22/22 0423 08/22/22 0859  BP: (!) 146/93 116/76  Pulse: 92 80  Resp: 18 16  Temp: (!) 97.5 F (36.4 C) 97.8 F (36.6 C)  SpO2: 100% 95%   Examination: Physical Exam:  Constitutional: Thin Caucasian male in NAD appears calm resting  Respiratory: Diminished to auscultation bilaterally, no wheezing, rales, rhonchi or crackles. Normal respiratory effort and patient is not tachypenic. No accessory muscle use. Unlabored breathing  Cardiovascular: RRR, no murmurs / rubs / gallops. S1 and S2 auscultated. No extremity edema. Abdomen: Soft, non-tender, non-distended. Bowel sounds positive.  GU: Deferred. Musculoskeletal: No clubbing / cyanosis of digits/nails. No joint deformity upper and lower extremities but has a pin in his ankle  Skin: No rashes, lesions, ulcers on a limited skin evaluation. No induration; Warm and dry.  Neurologic: Somnolent and drowsy and resting  Psychiatric: Impaired judgement and Insight.   Condition at discharge:  Guarded  The results of significant diagnostics from this hospitalization (including imaging, microbiology, ancillary and laboratory) are listed below for reference.   Imaging Studies: EEG adult  Result Date: 09-17-22 Charlsie Quest, MD      09-17-2022  9:24 PM Patient Name: CALVEN SANKER MRN: [id number] Epilepsy Attending: Charlsie Quest Referring Physician/Provider: Merlene Laughter, DO Date: September 17, 2022 Duration: 22.43 mins Patient history: 67yo m with ams getting eeg to evaluate for seizure Level of alertness: Awake AEDs during EEG study: Chlordiazepoxide Technical aspects: This EEG study was done with scalp electrodes positioned according to the 10-20 International system of electrode placement. Electrical activity was reviewed with band pass filter of 1-70Hz , sensitivity of 7 uV/mm, display speed of 46mm/sec with a 60Hz  notched filter applied as appropriate. EEG data were recorded continuously and digitally stored.  Video monitoring was available and reviewed as appropriate. Description: EEG showed continuous generalized 3 to 6 Hz theta-delta slowing admixed with 13-15Hz  generalized beta activity. Hyperventilation and photic stimulation were not performed.   ABNORMALITY - Continuous slow, generalized IMPRESSION: This study is suggestive of moderate diffuse encephalopathy, nonspecific etiology. No seizures or epileptiform discharges were seen throughout the recording. Charlsie Quest   MR BRAIN WO CONTRAST  Result Date: 08/17/2022 CLINICAL DATA:  Altered mental status EXAM: MRI HEAD WITHOUT CONTRAST TECHNIQUE: Multiplanar, multiecho pulse sequences of the brain and surrounding structures were obtained without intravenous contrast. COMPARISON:  CT head 1 day prior FINDINGS: Image quality is degraded by motion artifact. Brain: There is no acute intracranial hemorrhage, extra-axial fluid collection, or acute infarct There is background parenchymal volume loss with prominence of the ventricular system and extra-axial CSF spaces. There is patchy and confluent FLAIR signal abnormality in the supratentorial white matter likely reflecting sequela of moderate chronic small-vessel ischemic change, advanced for age. There are multiple remote lacunar  infarcts in the basal ganglia and thalami. The pituitary and suprasellar region are normal. There is no mass lesion. There is no mass effect or midline shift. Vascular: Normal flow voids. Skull and upper cervical spine: Normal marrow signal. Sinuses/Orbits: There is mild mucosal thickening in the paranasal sinuses. The globes and orbits are unremarkable. Other: The mastoid air cells and middle ear cavities are clear. IMPRESSION: Motion degraded study. No definite evidence of acute intracranial pathology. Electronically Signed   By: Lesia Hausen M.D.   On: 08/17/2022 17:25   DG CHEST PORT 1 VIEW  Result Date: 08/16/2022 CLINICAL DATA:  Leukocytosis. EXAM: PORTABLE CHEST 1 VIEW COMPARISON:  CT 04/09/2020 FINDINGS: The heart is upper normal in size. Mediastinal contours are normal. There is a small right pleural effusion. Bandlike opacity at the right lung base favored to be atelectasis. IMPRESSION: Small right pleural effusion. Bandlike opacity at the right lung base favored to be atelectasis. Electronically Signed   By: Narda Rutherford M.D.   On: 08/16/2022 17:08   DG Knee Complete 4 Views Left  Result Date: 08/16/2022 CLINICAL DATA:  Left knee pain. EXAM: LEFT KNEE - COMPLETE 4+ VIEW COMPARISON:  None Available. FINDINGS: No evidence of fracture, dislocation, or joint effusion. No evidence of arthropathy or other focal bone abnormality. Arterial vascular calcifications. Soft tissues are unremarkable. IMPRESSION: 1. Peripheral vascular disease. 2. Otherwise unremarkable radiographic appearance of the left knee. Electronically Signed   By:  Narda Rutherford M.D.   On: 08/16/2022 16:12   CT Head Wo Contrast  Result Date: 08/16/2022 CLINICAL DATA:  Head trauma EXAM: CT HEAD WITHOUT CONTRAST CT CERVICAL SPINE WITHOUT CONTRAST TECHNIQUE: Multidetector CT imaging of the head and cervical spine was performed following the standard protocol without intravenous contrast. Multiplanar CT image reconstructions of the  cervical spine were also generated. RADIATION DOSE REDUCTION: This exam was performed according to the departmental dose-optimization program which includes automated exposure control, adjustment of the mA and/or kV according to patient size and/or use of iterative reconstruction technique. COMPARISON:  04/09/2020 FINDINGS: CT HEAD FINDINGS Brain: No evidence of acute infarction, hemorrhage, hydrocephalus, extra-axial collection or mass lesion/mass effect. Periventricular and deep white matter hypodensity. Nonacute lacunar infarctions of the bilateral basal ganglia. Vascular: No hyperdense vessel or unexpected calcification. Skull: Normal. Negative for fracture or focal lesion. Sinuses/Orbits: No acute finding. Other: None. CT CERVICAL SPINE FINDINGS Alignment: Degenerative straightening of the normal cervical lordosis. Skull base and vertebrae: No acute fracture. No primary bone lesion or focal pathologic process. Soft tissues and spinal canal: No prevertebral fluid or swelling. No visible canal hematoma. 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 Upper chest: Negative. Other: None. IMPRESSION: 1. No acute intracranial pathology. 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 2. No fracture or static subluxation of the cervical spine. 3. 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 Electronically Signed   By: Jearld Lesch M.D.   On: 08/16/2022 14:49   CT Cervical Spine Wo Contrast  Result Date: 08/16/2022 CLINICAL DATA:  Head trauma EXAM: CT HEAD WITHOUT CONTRAST CT CERVICAL SPINE WITHOUT CONTRAST TECHNIQUE: Multidetector CT imaging of the head and cervical spine was performed following the standard protocol without intravenous contrast. Multiplanar CT image reconstructions of the cervical spine were also generated. RADIATION DOSE REDUCTION: This exam was performed according to the  departmental dose-optimization program which includes automated exposure control, adjustment of the mA and/or kV according to patient size and/or use of iterative reconstruction technique. COMPARISON:  04/09/2020 FINDINGS: CT HEAD FINDINGS Brain: No evidence of acute infarction, hemorrhage, hydrocephalus, extra-axial collection or mass lesion/mass effect. Periventricular and deep white matter hypodensity. Nonacute lacunar infarctions of the bilateral basal ganglia. Vascular: No hyperdense vessel or unexpected calcification. Skull: Normal. Negative for fracture or focal lesion. Sinuses/Orbits: No acute finding. Other: None. CT CERVICAL SPINE FINDINGS Alignment: Degenerative straightening of the normal cervical lordosis. Skull base and vertebrae: No acute fracture. No primary bone lesion or focal pathologic process. Soft tissues and spinal canal: No prevertebral fluid or swelling. No visible canal hematoma. 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 Upper chest: Negative. Other: None. IMPRESSION: 1. No acute intracranial pathology. 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 2. No fracture or static subluxation of the cervical spine. 3. 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 Electronically Signed   By: Jearld Lesch M.D.   On: 08/16/2022 14:49    Microbiology: Results for orders placed or performed during the hospital encounter of 04/09/20  Resp Panel by RT-PCR (Flu A&B, Covid) Nasopharyngeal Swab     Status: None   Collection Time: 04/09/20  1:49 PM   Specimen: Nasopharyngeal Swab; Nasopharyngeal(NP) swabs in vial transport medium  Result Value Ref Range Status   SARS Coronavirus 2 by RT PCR NEGATIVE NEGATIVE Final    Comment: (NOTE) SARS-CoV-2 target nucleic acids are  NOT DETECTED.  The SARS-CoV-2 RNA is generally detectable in upper respiratory specimens during the  acute phase of infection. The lowest concentration of SARS-CoV-2 viral copies this assay can detect is 138 copies/mL. A negative result does not preclude SARS-Cov-2 infection and should not be used as the sole basis for treatment or other patient management decisions. A negative result may occur with  improper specimen collection/handling, submission of specimen other than nasopharyngeal swab, presence of viral mutation(s) within the areas targeted by this assay, and inadequate number of viral copies(<138 copies/mL). A negative result must be combined with clinical observations, patient history, and epidemiological information. The expected result is Negative.  Fact Sheet for Patients:  BloggerCourse.com  Fact Sheet for Healthcare Providers:  SeriousBroker.it  This test is no t yet approved or cleared by the [place] FDA and  has been authorized for detection and/or diagnosis of SARS-CoV-2 by FDA under an Emergency Use Authorization (EUA). This EUA will remain  in effect (meaning this test can be used) for the duration of the COVID-19 declaration under Section 564(b)(1) of the Act, 21 U.S.C.section 360bbb-3(b)(1), unless the authorization is terminated  or revoked sooner.       Influenza A by PCR NEGATIVE NEGATIVE Final   Influenza B by PCR NEGATIVE NEGATIVE Final    Comment: (NOTE) The Xpert Xpress SARS-CoV-2/FLU/RSV plus assay is intended as an aid in the diagnosis of influenza from Nasopharyngeal swab specimens and should not be used as a sole basis for treatment. Nasal washings and aspirates are unacceptable for Xpert Xpress SARS-CoV-2/FLU/RSV testing.  Fact Sheet for Patients: BloggerCourse.com  Fact Sheet for Healthcare Providers: SeriousBroker.it  This test is not yet approved or cleared by the [place] FDA and has been authorized for detection and/or  diagnosis of SARS-CoV-2 by FDA under an Emergency Use Authorization (EUA). This EUA will remain in effect (meaning this test can be used) for the duration of the COVID-19 declaration under Section 564(b)(1) of the Act, 21 U.S.C. section 360bbb-3(b)(1), unless the authorization is terminated or revoked.  Performed at [facility], [address], [place], [place] [address]    Labs: CBC: Recent Labs  Lab 08/16/22 1144 08/17/22 0038 08/18/22 0851 08/19/22 0708 08/20/22 0101  WBC 15.9* 10.2 8.9 8.6 9.4  HGB 14.4 12.8* 13.4 15.2 15.3  HCT 42.0 38.1* 39.9 44.9 44.3  MCV 93.1 94.1 95.9 93.5 93.7  PLT 312 268 266 280 289   Basic Metabolic Panel: Recent Labs  Lab 08/16/22 1144 08/16/22 1600 08/17/22 0038 08/18/22 0851 08/19/22 0708 08/20/22 0101  NA 131*  --  134* 139 137 137  K 4.4  --  3.5 3.2* 3.9 3.9  CL 97*  --  103 104 102 104  CO2 20*  --  25 27 23 22   GLUCOSE 100*  --  96 95 89 108*  BUN 24*  --  16 9 9 14   CREATININE 1.12  --  1.05 0.83 0.79 0.93  CALCIUM 9.4  --  8.7* 8.6* 9.3 9.3  MG  --  2.2  --  1.9  --  2.1  PHOS  --  3.6  --  2.7  --  4.3   Liver Function Tests: Recent Labs  Lab 08/16/22 1600 08/17/22 0038 08/18/22 0851 08/20/22 0101  AST 47* 50*  --  27  ALT 35 29  --  24  ALKPHOS 63 52  --  60  BILITOT 0.9 0.8  --  0.8  PROT 7.1 5.8*  --  6.6  ALBUMIN 4.3 3.4* 3.4* 3.7   CBG: Recent Labs  Lab 08/16/22 1149  GLUCAP 99   Discharge time spent: greater than 30 minutes.  Signed: Marguerita Merles, DO [facility] 08/22/2022

## 2022-08-22 NOTE — Progress Notes (Signed)
OT Cancellation Note  Patient Details Name: Gary Mathis MRN: [id number] DOB: [birth year]   Cancelled Treatment:    Reason Eval/Treat Not Completed: Other (comment) (Pt scheduled for skilled OT re-eval this day. However, per conversation with RN and note from Palliative Care team, pt is being transitioned to comfort measures only. OT signing off at this time. OT available to reassess as needed/appropriate.)  Blasa Raisch "Orson Eva., OTR/L, MA Acute Rehab [phone]   Lendon Colonel 08/22/2022, 3:14 PM

## 2022-09-11 DEATH — deceased
